# Patient Record
Sex: Male | Born: 1955 | Race: White | Hispanic: No | Marital: Married | Smoking: Former smoker
Health system: Southern US, Community
[De-identification: ages and names within clinical notes are randomized; demographics above are authoritative.]

## PROBLEM LIST (undated history)

## (undated) DIAGNOSIS — I10 Essential (primary) hypertension: Secondary | ICD-10-CM

## (undated) DIAGNOSIS — K219 Gastro-esophageal reflux disease without esophagitis: Secondary | ICD-10-CM

## (undated) DIAGNOSIS — Z5189 Encounter for other specified aftercare: Secondary | ICD-10-CM

## (undated) DIAGNOSIS — IMO0001 Reserved for inherently not codable concepts without codable children: Secondary | ICD-10-CM

## (undated) DIAGNOSIS — C801 Malignant (primary) neoplasm, unspecified: Secondary | ICD-10-CM

## (undated) HISTORY — PX: NECK SURGERY: SHX720

## (undated) HISTORY — PX: PROSTATE SURGERY: SHX751

---

## 1998-01-15 ENCOUNTER — Ambulatory Visit (HOSPITAL_COMMUNITY): Admission: RE | Admit: 1998-01-15 | Discharge: 1998-01-15 | Payer: Self-pay | Admitting: Internal Medicine

## 1998-11-23 ENCOUNTER — Ambulatory Visit (HOSPITAL_COMMUNITY): Admission: RE | Admit: 1998-11-23 | Discharge: 1998-11-23 | Payer: Self-pay | Admitting: Internal Medicine

## 1998-11-23 ENCOUNTER — Encounter: Payer: Self-pay | Admitting: Internal Medicine

## 2003-01-26 ENCOUNTER — Emergency Department (HOSPITAL_COMMUNITY): Admission: EM | Admit: 2003-01-26 | Discharge: 2003-01-26 | Payer: Self-pay | Admitting: Emergency Medicine

## 2003-01-26 ENCOUNTER — Encounter: Payer: Self-pay | Admitting: Emergency Medicine

## 2003-03-10 ENCOUNTER — Encounter: Payer: Self-pay | Admitting: Internal Medicine

## 2003-03-10 ENCOUNTER — Ambulatory Visit (HOSPITAL_COMMUNITY): Admission: RE | Admit: 2003-03-10 | Discharge: 2003-03-10 | Payer: Self-pay | Admitting: *Deleted

## 2003-03-10 ENCOUNTER — Encounter (INDEPENDENT_AMBULATORY_CARE_PROVIDER_SITE_OTHER): Payer: Self-pay | Admitting: Specialist

## 2008-05-02 DIAGNOSIS — I10 Essential (primary) hypertension: Secondary | ICD-10-CM | POA: Insufficient documentation

## 2008-05-02 DIAGNOSIS — J45909 Unspecified asthma, uncomplicated: Secondary | ICD-10-CM | POA: Insufficient documentation

## 2008-05-05 ENCOUNTER — Ambulatory Visit: Payer: Self-pay | Admitting: Internal Medicine

## 2008-05-05 DIAGNOSIS — R1013 Epigastric pain: Secondary | ICD-10-CM

## 2008-05-05 DIAGNOSIS — Z860100 Personal history of colon polyps, unspecified: Secondary | ICD-10-CM | POA: Insufficient documentation

## 2008-05-30 ENCOUNTER — Ambulatory Visit: Payer: Self-pay | Admitting: Internal Medicine

## 2008-05-30 ENCOUNTER — Encounter: Payer: Self-pay | Admitting: Internal Medicine

## 2008-06-03 ENCOUNTER — Encounter: Payer: Self-pay | Admitting: Internal Medicine

## 2008-08-14 ENCOUNTER — Encounter: Payer: Self-pay | Admitting: Internal Medicine

## 2009-03-31 ENCOUNTER — Encounter: Payer: Self-pay | Admitting: Emergency Medicine

## 2009-04-02 ENCOUNTER — Encounter: Admission: RE | Admit: 2009-04-02 | Discharge: 2009-04-02 | Payer: Self-pay | Admitting: Internal Medicine

## 2009-04-09 ENCOUNTER — Encounter: Payer: Self-pay | Admitting: Emergency Medicine

## 2009-04-09 ENCOUNTER — Ambulatory Visit (HOSPITAL_COMMUNITY): Admission: RE | Admit: 2009-04-09 | Discharge: 2009-04-09 | Payer: Self-pay | Admitting: Internal Medicine

## 2009-05-11 ENCOUNTER — Ambulatory Visit: Payer: Self-pay | Admitting: Emergency Medicine

## 2009-05-11 DIAGNOSIS — R0602 Shortness of breath: Secondary | ICD-10-CM | POA: Insufficient documentation

## 2009-05-13 ENCOUNTER — Ambulatory Visit (HOSPITAL_COMMUNITY): Admission: RE | Admit: 2009-05-13 | Discharge: 2009-05-13 | Payer: Self-pay | Admitting: Emergency Medicine

## 2009-05-15 ENCOUNTER — Ambulatory Visit: Payer: Self-pay | Admitting: Emergency Medicine

## 2009-06-19 ENCOUNTER — Ambulatory Visit: Payer: Self-pay | Admitting: Emergency Medicine

## 2009-06-19 DIAGNOSIS — J309 Allergic rhinitis, unspecified: Secondary | ICD-10-CM | POA: Insufficient documentation

## 2009-07-27 ENCOUNTER — Encounter: Payer: Self-pay | Admitting: Emergency Medicine

## 2010-07-12 ENCOUNTER — Encounter: Admission: RE | Admit: 2010-07-12 | Discharge: 2010-07-12 | Payer: Self-pay | Admitting: Sports Medicine

## 2010-07-27 ENCOUNTER — Encounter
Admission: RE | Admit: 2010-07-27 | Discharge: 2010-07-27 | Payer: Self-pay | Source: Home / Self Care | Attending: Sports Medicine | Admitting: Sports Medicine

## 2010-08-06 ENCOUNTER — Ambulatory Visit (HOSPITAL_COMMUNITY)
Admission: RE | Admit: 2010-08-06 | Discharge: 2010-08-06 | Payer: Self-pay | Source: Home / Self Care | Attending: Neurosurgery | Admitting: Neurosurgery

## 2010-08-30 ENCOUNTER — Ambulatory Visit (HOSPITAL_COMMUNITY)
Admission: RE | Admit: 2010-08-30 | Discharge: 2010-08-31 | Payer: Self-pay | Source: Home / Self Care | Attending: Neurosurgery | Admitting: Neurosurgery

## 2010-08-30 LAB — CBC
HCT: 41 % (ref 39.0–52.0)
Hemoglobin: 14.3 g/dL (ref 13.0–17.0)
MCH: 32.5 pg (ref 26.0–34.0)
MCHC: 34.9 g/dL (ref 30.0–36.0)
MCV: 93.2 fL (ref 78.0–100.0)
Platelets: 191 10*3/uL (ref 150–400)
RBC: 4.4 MIL/uL (ref 4.22–5.81)
RDW: 13.2 % (ref 11.5–15.5)
WBC: 8.9 10*3/uL (ref 4.0–10.5)

## 2010-08-30 LAB — SURGICAL PCR SCREEN
MRSA, PCR: NEGATIVE
Staphylococcus aureus: NEGATIVE

## 2010-08-30 LAB — BASIC METABOLIC PANEL
BUN: 6 mg/dL (ref 6–23)
CO2: 28 mEq/L (ref 19–32)
Calcium: 10.1 mg/dL (ref 8.4–10.5)
Chloride: 95 mEq/L — ABNORMAL LOW (ref 96–112)
Creatinine, Ser: 0.82 mg/dL (ref 0.4–1.5)
GFR calc Af Amer: >60 mL/min (ref >60)
GFR calc non Af Amer: >60 mL/min (ref >60)
Glucose, Bld: 98 mg/dL (ref 70–99)
Potassium: 3.6 mEq/L (ref 3.5–5.1)
Sodium: 135 mEq/L (ref 135–145)

## 2010-09-07 NOTE — Letter (Signed)
Summary: [facility]  [facility]   Imported By: Lester Arcade 10/01/2009 08:56:11  _____________________________________________________________________  External Attachment:    Type:   Image     Comment:   External Document

## 2010-09-09 NOTE — Op Note (Signed)
NAME:  KAIL, FRALEY NO.:  [id number]  MEDICAL RECORD NO.:  [id number]          PATIENT TYPE:  OIB  LOCATION:  3533                         FACILITY:  [facility]  PHYSICIAN:  Hewitt Shorts, M.D.DATE OF BIRTH:  1956/04/19  DATE OF PROCEDURE:  08/30/2010 DATE OF DISCHARGE:                              OPERATIVE REPORT   PREOPERATIVE DIAGNOSES:  Cervical spondylosis, cervical degenerative disk disease, cervical stenosis, and cervical radiculopathy.  POSTOPERATIVE DIAGNOSES:  Cervical spondylosis, cervical degenerative disk disease, cervical stenosis, and cervical radiculopathy.  PROCEDURE:  C5-6, C6-7, and C7-T1 anterior cervical decompression, arthrodesis with allograft and Tether cervical plating.  SURGEON:  Hewitt Shorts, MD  ASSISTANT:  Clydene Fake, MD  ANESTHESIA:  General endotracheal.  INDICATIONS:  The patient is a 55 year old man who presented with right cervical radiculopathy with weakness of the intrinsics and grip of the right hand.  He was found with x-rays, MRI scan, myelogram, and postmyelogram CT scan which showed multilevel degenerative disk disease, spondylosis with significant neural foraminal stenosis particularly on the right at C6-7 and C7-T1.  Decision was made to proceed with three- level anterior decompression and arthrodesis.  PROCEDURE:  The patient was brought to the operating room and placed on general anesthesia.  The patient was placed in 10 pounds of Holter traction.  Neck was prepped with Betadine soap and solution and draped in a sterile fashion.  An oblique incision was made on the left side of neck paralleling the anterior border of the left sternocleidomastoid. Dissection was carried down through the subcutaneous tissue and platysma.  Bipolar electrocautery was used to maintain hemostasis. Dissection was then carried out through the avascular plane leaving the sternocleidomastoid, carotid, and jugular vein  laterally and trachea and esophagus medially.  The ventral aspect of vertebral column was identified and localized x-ray was taken.  The C5-6, C6-7, C7-T1 intervertebral disk space was identified.  Next, the operating microscope was draped and brought onto the field to provide additional navigation, illumination, and visualization.  The remainder of decompression was performed using microdissection and microsurgical technique.  Diskectomy was begun at the each level with incision of the annulus and continued with microcurettes and pituitary rongeurs. Anterior osteophytic overgrowth was removed using the high-speed drill and 2-mm Kerrison punch.  Each level showed significant spondylotic degeneration.  The cartilaginous endplates were carefully removed using microcurettes and the high-speed drill and then posterior osteophytic overgrowth was removed at each level using the high-speed drill along with a 2-mm Kerrison punch with a thin footplate.  Posterior longitudinal ligament was opened and then we carefully removed the posterior longitudinal ligament using a 2-mm Kerrison punch with a thin footplate.  The dissection was carried laterally and the decompression was carried out laterally decompressing the neural foramen with particular attention paid to the right-sided neural foramen.  We were able to identify the nerve root and decompressed the spondylitic impingement and encroachment at each level.  Once decompression was completed, hemostasis was established with the use of Gelfoam soaked in thrombin.  We selected three 7-mm allograft implants and positioned in the intervertebral disk space and countersunk.  We placed Gelfoam  lateral to the graft and then selected a 52-mm three-level Tether cervical plate.  It was secured to the vertebra with 4 x 14 mm screws using variable screws at C5, single fixed screw at C6, single variable screws at C7, and the PerFix screws at T1.  Each screw hole  was drilled and tapped and the screws were placed in alternating fashion.  Once all 6 screws were in place, final tightening was performed.  The wound was irrigated with bacitracin solution.  An x-ray was taken which showed only the upper portion of the construct, which showed the plate, screws, and grafts in good position.  Once hemostasis was established, we proceeded with closure.  Platysma was approximated with interrupted 2-0 undyed Vicryl sutures.  Subcutaneous and subcuticular were closed with inverted 3-0 undyed Vicryl sutures.  Skin was approximated with Dermabond.  Procedure was tolerated well.  Estimated blood loss was 100 mL.  Sponge count correct.  Following surgery, the patient was taken out of traction to be reversed from the anesthetic, extubated, and transferred to the recovery room for further care.     Hewitt Shorts, M.D.     RWN/MEDQ  D:  08/30/2010  T:  08/31/2010  Job:  [id number]  Electronically Signed by Shirlean Kelly M.D. on 09/09/2010 10:28:17 AM

## 2010-12-24 NOTE — Consult Note (Signed)
NAME:  Blackburn, Kevin.:  [id number]   MEDICAL RECORD NO.:  [id number]                   PATIENT TYPE:  EMS   LOCATION:  MAJO                                 FACILITY:  [facility]   PHYSICIAN:  Candyce Churn, M.D.          DATE OF BIRTH:  [birth year]   DATE OF CONSULTATION:  DATE OF DISCHARGE:                                   CONSULTATION   CHIEF COMPLAINT:  Weakness and anxiousness.   HISTORY OF PRESENT ILLNESS:  [name] is a pleasant 55 year old male with  a history of  well controlled  hypertension and mild allergic rhinitis. Mr.  Kevin Blackburn was feeling in his usual state of health until a couple of days ago  he felt slightly weak. He actually felt OK yesterday, but then this morning  after going to the hospital  and having lunch where his father is  hospitalized for recurrent surgery for colon cancer, he felt weak and  somewhat shaky at the hospital  and his wife drove him home. On returning  home he felt weak and shaky and jittery and his hands became very tingly.  His wife noticed that his hands seemed to be both cyanotic and then turned  colors. He felt more anxious at that point  and was thinking that he might  be hyperventilating. He was brought to the [facility] emergency  room and an EKG revealed premature ventricular contractions.   He has never had a history of coronary artery disease. He exerts himself  physically quite rigorously on a daily basis at his work and has never had  exertional chest, throat or arm pain. It has just been in the last 24 to 48  hours when he has felt somewhat weak.   MEDICATIONS:  1. Lisinopril 10 mg daily.  2. Hydrochlorothiazide 12.5 mg daily.  3. Potassium 20 mEq daily.   PHYSICAL EXAMINATION:  VITAL SIGNS:  Blood pressure 175/96, temperature  98,  pulse 121, respiratory rate was 20 and easy. This was on admission; after  resting quietly his pulse is 97, respiratory rate is 16 and easy  and he  feels quite well.  GENERAL:  He no longer had the tingling in his hands.  HEENT:  Normal cranial nerves.  NECK:  No JVD, no thyromegaly, no thyroid tenderness. Neck is supple.  CHEST:  Clear to auscultation.  CARDIAC:  Regular rate and rhythm. Monitor  reveals occasional PVC.   LABORATORY DATA:  An EKG on admission revealed sinus rhythm with premature  ventricular contractions every 4th or 5th beat. There is no ST segment  elevation or depression. No Q-waves present. Otherwise normal except for the  premature ventricular contractions.   Creatinine 1.0, glucose 97, BUN 8,  Sodium 131, potassium 3.6, chloride  97,  bicarbonate 22. pH 7.447, pCO2 is low at 30. Hemoglobin 16, hematocrit 48.   ASSESSMENT:  A 55 year old male with a history of  well controlled  hypertension, but on premature history of coronary artery disease or smoking  history or high cholesterol. I suspect that he may have been hyperadrenergic  from anxiety. I think he has been under a lot of stress secondary to his  father being sick and what that means in terms of his care for  his mother.   He did have a chest CT scan today which revealed no evidence of pulmonary  embolism and otherwise  appeared  normal. There is no deep venous thrombosis  in his lower extremities.   I suspect that he may have actually had a Raynaud's like phenomenon occur. I  do think that he almost certainly was hyperventilating. He has tended to  have some anxiety in the past as well.   PLAN:  1. Will treat with Lopressor 25 mg b.i.d. with first dose in the morning.  2. Xanax 0.25 mg q.6-8h. p.r.n.  3. Plan to see back in the office in 24 to 48 hours and will likely discuss     having him perform a stress test later this week, although my suspicion     for coronary artery disease is extremely low. His hand tingling has     resolved, and he does state that he felt that he was probably was     hyperventilating.   He will call me if  any of these symptoms reoccur over the next 24 hours. I  will be giving him my beeper number as well.                                               Candyce Churn, M.D.    RNG/MEDQ  D:  01/26/2003  T:  01/27/2003  Job:  [id number]

## 2011-01-07 ENCOUNTER — Other Ambulatory Visit: Payer: Self-pay | Admitting: Urology

## 2011-01-07 ENCOUNTER — Encounter (HOSPITAL_COMMUNITY): Payer: 59

## 2011-01-07 LAB — CBC
HCT: 45 % (ref 39.0–52.0)
Hemoglobin: 16.6 g/dL (ref 13.0–17.0)
MCH: 33.9 pg (ref 26.0–34.0)
MCHC: 36.9 g/dL — ABNORMAL HIGH (ref 30.0–36.0)
MCV: 92 fL (ref 78.0–100.0)
RBC: 4.89 MIL/uL (ref 4.22–5.81)

## 2011-01-07 LAB — BASIC METABOLIC PANEL
BUN: 6 mg/dL (ref 6–23)
CO2: 32 mEq/L (ref 19–32)
Calcium: 10 mg/dL (ref 8.4–10.5)
Chloride: 87 mEq/L — ABNORMAL LOW (ref 96–112)
Creatinine, Ser: 0.69 mg/dL (ref 0.4–1.5)
GFR calc Af Amer: >60 mL/min (ref >60)
Glucose, Bld: 97 mg/dL (ref 70–99)

## 2011-01-07 LAB — SURGICAL PCR SCREEN: MRSA, PCR: NEGATIVE

## 2011-01-12 ENCOUNTER — Inpatient Hospital Stay (HOSPITAL_COMMUNITY)
Admission: RE | Admit: 2011-01-12 | Discharge: 2011-01-12 | DRG: 641 | Disposition: A | Discharge status: Home / Self Care | Payer: 59 | Source: Ambulatory Visit | Attending: Urology | Admitting: Urology

## 2011-01-12 DIAGNOSIS — E876 Hypokalemia: Principal | ICD-10-CM | POA: Diagnosis present

## 2011-01-12 DIAGNOSIS — Z5309 Procedure and treatment not carried out because of other contraindication: Secondary | ICD-10-CM

## 2011-01-12 DIAGNOSIS — C61 Malignant neoplasm of prostate: Secondary | ICD-10-CM | POA: Diagnosis present

## 2011-01-12 DIAGNOSIS — Z01812 Encounter for preprocedural laboratory examination: Secondary | ICD-10-CM

## 2011-01-12 LAB — POTASSIUM: Potassium: 2.7 mEq/L — CL (ref 3.5–5.1)

## 2011-01-12 LAB — TYPE AND SCREEN: Antibody Screen: NEGATIVE

## 2011-01-19 ENCOUNTER — Encounter (HOSPITAL_COMMUNITY): Payer: 59

## 2011-01-19 ENCOUNTER — Other Ambulatory Visit: Payer: Self-pay | Admitting: Urology

## 2011-01-21 ENCOUNTER — Other Ambulatory Visit: Payer: Self-pay | Admitting: Urology

## 2011-01-21 ENCOUNTER — Inpatient Hospital Stay (HOSPITAL_COMMUNITY)
Admission: RE | Admit: 2011-01-21 | Discharge: 2011-01-22 | DRG: 708 | Disposition: A | Discharge status: Home / Self Care | Payer: 59 | Source: Ambulatory Visit | Attending: Urology | Admitting: Urology

## 2011-01-21 DIAGNOSIS — I10 Essential (primary) hypertension: Secondary | ICD-10-CM | POA: Diagnosis present

## 2011-01-21 DIAGNOSIS — Z01812 Encounter for preprocedural laboratory examination: Secondary | ICD-10-CM

## 2011-01-21 DIAGNOSIS — K219 Gastro-esophageal reflux disease without esophagitis: Secondary | ICD-10-CM | POA: Diagnosis present

## 2011-01-21 DIAGNOSIS — Z8042 Family history of malignant neoplasm of prostate: Secondary | ICD-10-CM

## 2011-01-21 DIAGNOSIS — C61 Malignant neoplasm of prostate: Principal | ICD-10-CM | POA: Diagnosis present

## 2011-01-21 LAB — CBC
MCH: 32.5 pg (ref 26.0–34.0)
MCHC: 34.7 g/dL (ref 30.0–36.0)
MCV: 93.6 fL (ref 78.0–100.0)
Platelets: 254 10*3/uL (ref 150–400)
RBC: 4.4 MIL/uL (ref 4.22–5.81)

## 2011-01-21 LAB — BASIC METABOLIC PANEL
CO2: 27 mEq/L (ref 19–32)
Calcium: 8.7 mg/dL (ref 8.4–10.5)
Creatinine, Ser: 0.78 mg/dL (ref 0.50–1.35)
GFR calc non Af Amer: >60 mL/min (ref >60)
Glucose, Bld: 111 mg/dL — ABNORMAL HIGH (ref 70–99)

## 2011-01-21 LAB — TYPE AND SCREEN: ABO/RH(D): O POS

## 2011-01-22 LAB — DIFFERENTIAL
Basophils Absolute: 0 10*3/uL (ref 0.0–0.1)
Basophils Relative: 0 % (ref 0–1)
Eosinophils Absolute: 0.1 10*3/uL (ref 0.0–0.7)
Eosinophils Relative: 1 % (ref 0–5)
Monocytes Absolute: 1.2 10*3/uL — ABNORMAL HIGH (ref 0.1–1.0)

## 2011-01-22 LAB — BASIC METABOLIC PANEL
BUN: 7 mg/dL (ref 6–23)
Calcium: 8.4 mg/dL (ref 8.4–10.5)
GFR calc non Af Amer: >60 mL/min (ref >60)
Glucose, Bld: 121 mg/dL — ABNORMAL HIGH (ref 70–99)
Sodium: 128 mEq/L — ABNORMAL LOW (ref 135–145)

## 2011-01-22 LAB — CBC: MCH: 32.2 pg (ref 26.0–34.0)

## 2011-02-17 NOTE — Op Note (Signed)
NAME:  Kevin Blackburn, Kevin Blackburn NO.:  [id number]  MEDICAL RECORD NO.:  [id number]  LOCATION:  1412                         FACILITY:  [facility]  PHYSICIAN:  Valetta Fuller, M.D.  DATE OF BIRTH:  12/29/1955  DATE OF PROCEDURE:  01/21/2011 DATE OF DISCHARGE:  01/22/2011                              OPERATIVE REPORT   PREOPERATIVE DIAGNOSIS:  Adenocarcinoma of the prostate.  POSTOPERATIVE DIAGNOSIS:  Adenocarcinoma of the prostate.  PROCEDURE PERFORMED:  Robotic-assisted laparoscopic radical retropubic prostatectomy with bilateral pelvic lymph node dissection.  SURGEON:  Valetta Fuller, M.D.  ASSISTANT:  Delia Chimes, NP  ANESTHESIA:  General endotracheal  INDICATIONS:  Mr. Burdell is 55 years of age.  The patient was sent to see me with an elevated PSA of just over 7 and a positive family history of prostate cancer.  The patient's ultrasound revealed a small 20-g prostate and digital rectal exam was unremarkable.  On the right side, the patient had Gleason 3+4= 7 involving 3 out of 6 cores.  Left-sided biopsies were negative for cancer, although 4 out of 6 cores did show either focal high-grade PIN or some atypia.  The patient underwent extensive counseling with regard to treatment options.  He was felt to have intermediate risk clinical stage T1c adenocarcinoma of the prostate.  After lengthy discussions, he elected to proceed with a surgical approach.  He understands the advantages and disadvantages and potential complications of such surgery.  The patient's procedure was actually scheduled for last week but he was noted to have hypokalemia. His surgery was temporarily postponed while he received potassium supplementation and repeat potassium was 4.2.  The patient has done a mechanical bowel prep.  He has had placement of PAS compression boots and received perioperative Unasyn.  TECHNIQUE AND FINDINGS:  The patient was brought to the operating room where he had  successful induction of general endotracheal anesthesia. He was placed in lithotomy position.  All extremities were carefully padded and he was secured to the operative table and then placed in steep Trendelenburg position.  He was prepped and draped in usual manner.  Appropriate surgical time-out was performed.  Foley catheter was inserted sterilely on the field.  Camera port incision was chosen 18 cm above the pubic symphysis just left to the umbilicus.  Standard open Roseanne Reno technique was utilized and placement of 12 mm trocar and initial insufflation of the abdomen occurred without incident.  Evaluation of the pelvis revealed no obvious abnormalities and all other trocars were placed with direct visual guidance including three 8-mm robotic trocars and additional 12-mm and 5-mm assist trocars.  Once trocars were placed, the surgical cart was docked.  The bladder was filled and space of Retzius developed with electrocautery scissors and blunt dissecting technique.  Fatty tissue over the endopelvic fascia and bladder neck was removed with electrocautery scissors.  Endopelvic fascia was then incised from base to apex.  There was quite a bit of thickened levator musculature adherent to the apex of the prostate bilaterally.  This was swept away isolating the dorsal venous complex which was then stapled with an ETS stapling device with good hemostasis.  The Foley balloon was utilized to identify the  anterior bladder neck which was then transected.  Indigo carmine was given and we were well away from the ureteral orifices.  There was no evidence of middle lobe of the prostate.  The posterior bladder neck was transected and the dissection carried down to the vas deferens which were individually dissected free and then retracted anteriorly.  Seminal vesicles were also dissected in a similar manner with clips placed on the tips of the seminal vesicles. With these structures held anteriorly, the  posterior plane between the prostate and rectum was established with blunt dissection.  We felt that the patient was a candidate primarily for unilateral nerve spare.  On the side with the cancer, the right side, a wide excision of the neurovascular bundle was undertaken utilizing an Enseal device.  On the left side, superficial fascia anteriorly on the prostate was incised and then swept posterolaterally until we were able to establish a groove between the lateral aspect of the prostate and the neurovascular bundle. With the prostate retracted anteriorly, the pedicle of the prostate was taken down with Enseal device as well as some clips.  With the Foley in position, the anterior urethra was transected.  The posterior urethra was then transected and rectourethralis muscle sharply incised allowing for removal of the prostate specimen.  Rectal insufflation revealed no evidence of any rectal injury.  Hemostasis remained quite good.  Bilateral pelvic lymph node dissections were performed removing the obturator packets and extending upward to the bifurcation of the iliac artery.  No grossly enlarged nodes were encountered.  Obturator nerves were identified bilaterally and preserved and small clips were used for venous and lymphatic channels.  Both packets were sent separately right and left for permanent analysis.  Attention was then turned to reconstruction.  The bladder neck was small and required no additional reconstruction.  Indigo carmine was again given and we were well away from the ureteral orifices.  The posterior bladder neck and posterior urethra were reapproximated with a 2-0 Vicryl suture.  The rest of the anastomosis was done with a double-armed 3-0 Monocryl suture in a running manner.  A new catheter was placed and irrigation revealed no evidence of leakage.  Irrigation of the bladder revealed blue dye tinged irrigant.  A pelvic drain was placed through one of the robotic  trocars and secured to the skin.  The prostate was placed in an Endopouch retrieval system.  The 12-mm trocar site was closed with Vicryl suture with aid of a suture passer under direct visual guidance.  Other trocars were taken out with visual guidance. The camera port incision was extended slightly to allow for removal of specimen and that fascia was closed with a running Vicryl.  All port and incision sites were infiltrated with Marcaine and closed with clips. The patient did not appear to have any obvious complications and was brought to recovery room in stable condition.     Valetta Fuller, M.D.     DSG/MEDQ  D:  01/23/2011  T:  01/24/2011  Job:  [id number]  cc:   Pearla Dubonnet, M.D. Fax: [phone]  Electronically Signed by Barron Alvine M.D. on 02/17/2011 05:43:54 PM

## 2011-03-02 NOTE — Discharge Summary (Signed)
NAME:  Kevin Blackburn, LATA NO.:  [id number]  MEDICAL RECORD NO.:  [id number]  LOCATION:  1412                         FACILITY:  [facility]  PHYSICIAN:  Valetta Fuller, MD    DATE OF BIRTH:  12-12-1955  DATE OF ADMISSION:  01/21/2011 DATE OF DISCHARGE:  01/22/2011                              DISCHARGE SUMMARY   ADMISSION DIAGNOSIS:  Adenocarcinoma of the prostate.  DISCHARGE DIAGNOSIS:  Adenocarcinoma of the prostate.  PROCEDURE: 1. Robotic-assisted laparoscopic radical retropubic prostatectomy. 2. Bilateral pelvic lymph node dissection.  HISTORY AND PHYSICAL:  For full details, please see admission history and physical.  Briefly, [name] is a 55 year old gentleman who was found to have clinically-localized adenocarcinoma of the prostate. After careful consideration regarding management options for treatment, he elected to proceed with surgical therapy and a robotic-assisted laparoscopic radical retropubic prostatectomy.  HOSPITAL COURSE:  On January 21, 2011, he was taken to the operating room where he underwent the above-named procedures which he tolerated well without complications.  Postoperatively, he was able to be transferred to a regular hospital room following recovery from the anesthesia.  He was found to be hemodynamically stable postoperatively as noted by hemoglobin of 14.3.  He was also found to be stable on postop day #1 as noted by hemoglobin of 11.9.  He was able to begin a clear liquids diet and ambulation after surgery.  On the morning of postoperative day #1, he was able to be transitioned to oral pain medications.  He was found to have excellent urine output within the first 24 hours with minimal output from his pelvic drain; therefore, his pelvic drain was removed on postoperative day #1.  On the mid morning of postoperative day #1, he was ambulating without difficulty, his pain was well managed, and he was found to be tolerating a clear  liquid diet without complications. Therefore, he was felt stable for discharge home as he had met all discharge criteria.  DISPOSITION:  Home.  DISCHARGE MEDICATIONS:  He was instructed to resume all home medications.  In addition, he was instructed to hold his naproxen, aspirin, Senna, and calcium with vitamin D for 7 to 10 days.  He was given a prescription for Vicodin to take as needed for pain and told to use Colace as stool softener.  He was also given a prescription for Cipro to begin 2 days prior to return visit for removal of Foley catheter.  DISCHARGE INSTRUCTIONS:  He was instructed to be ambulatory but specifically told to refrain from any heavy lifting, strenuous activity, or driving.  He was instructed on routine Foley catheter care and told to gradually advance his diet over the course of the next few days.  PATHOLOGY:  His pathology returned as prostatic adenocarcinoma, Gleason score 3 + 4, 8 + 7 involving both lobes.  Clinical stage, pT2c, pN0.  FOLLOWUP:  He will follow up in 7 to 10 days for further postoperative evaluation including removal of skin staples and Foley catheter.     Delia Chimes, NP   ______________________________ Valetta Fuller, MD    MA/MEDQ  D:  02/23/2011  T:  02/23/2011  Job:  [id number]  Electronically Signed by  MELISSA ADAMS NP on 02/24/2011 10:38:11 AM Electronically Signed by Barron Alvine M.D. on 03/02/2011 08:44:34 AM

## 2011-03-02 NOTE — H&P (Signed)
  NAME:  Kevin Blackburn, KORAL NO.:  [id number]  MEDICAL RECORD NO.:  [id number]  LOCATION:  1412                         FACILITY:  [facility]  PHYSICIAN:  Valetta Fuller, MD    DATE OF BIRTH:  1956/06/19  DATE OF ADMISSION:  01/21/2011 DATE OF DISCHARGE:  01/22/2011                             HISTORY & PHYSICAL   ADMITTING DIAGNOSIS:  Adenocarcinoma of the prostate.  The patient to be admitted status post robotic-assisted laparoscopic radical retropubic prostatectomy and bilateral pelvic lymph node dissection.  HISTORY OF PRESENT ILLNESS:  [name] is 55 years of age.  The patient was diagnosed with prostate cancer after PSA elevation was noted to be just over 7.  Rectal exam was unremarkable.  Ultrasound revealed a small 20-g prostate.  The patient had 3 out of 6 cores positive on the right side for Gleason 3+4=7 cancer.  Left-sided biopsies were negative. The patient underwent extensive counseling with regard to treatment options and elected to proceed with robotic prostatectomy along with pelvic lymph node dissection.  The patient was originally scheduled for surgery approximately 10 days ago but was noted to be profoundly hyperkalemic and the surgery was cancelled by anesthesia services.  He has had potassium replacement and repeat potassium recently was 4.2. The patient now presents for the surgery with admission status post the surgery.  PAST MEDICAL HISTORY:  Notable for hypertension, asthma, and gastroesophageal reflux.  CURRENT MEDICATIONS:  Include potassium chloride, albuterol inhaler, Symbicort inhaler, amlodipine/benazepril, atenolol, omeprazole, calcium/vitamin D.  ALLERGIES:  The patient has an allergy to SULFA.  PHYSICAL EXAMINATION:  GENERAL:  On exam, the patient is a well- developed, well-nourished male, in no acute distress. NECK:  Showed no obvious JVD or masses. LUNGS:  Respiratory effort normal. HEART:  Regular rate and  rhythm. ABDOMEN:  Soft, nontender without hepatosplenomegaly or gross masses. GU:  External genitalia within normal limits.  Rectal exam revealed a 1+ prostate without nodules or induration. EXTREMITIES:  Without edema.  ASSESSMENT:  Intermediate risk clinical stage T1c adenocarcinoma of the prostate.  The patient will be hopefully admitted for routine postoperative care status post robotic prostatectomy with bilateral pelvic lymph node dissection.     Valetta Fuller, MD     DSG/MEDQ  D:  02/23/2011  T:  02/23/2011  Job:  [id number]  Electronically Signed by Barron Alvine M.D. on 03/02/2011 08:44:25 AM

## 2011-11-27 ENCOUNTER — Inpatient Hospital Stay (HOSPITAL_COMMUNITY)
Admission: EM | Admit: 2011-11-27 | Discharge: 2011-11-29 | DRG: 683 | Disposition: A | Discharge status: Other Acute Inpatient Hospital | Payer: 59 | Attending: Internal Medicine | Admitting: Internal Medicine

## 2011-11-27 ENCOUNTER — Encounter (HOSPITAL_COMMUNITY): Payer: Self-pay

## 2011-11-27 DIAGNOSIS — K219 Gastro-esophageal reflux disease without esophagitis: Secondary | ICD-10-CM | POA: Diagnosis present

## 2011-11-27 DIAGNOSIS — D6959 Other secondary thrombocytopenia: Secondary | ICD-10-CM | POA: Diagnosis present

## 2011-11-27 DIAGNOSIS — N289 Disorder of kidney and ureter, unspecified: Secondary | ICD-10-CM

## 2011-11-27 DIAGNOSIS — J309 Allergic rhinitis, unspecified: Secondary | ICD-10-CM | POA: Diagnosis present

## 2011-11-27 DIAGNOSIS — J45909 Unspecified asthma, uncomplicated: Secondary | ICD-10-CM | POA: Diagnosis present

## 2011-11-27 DIAGNOSIS — T465X5A Adverse effect of other antihypertensive drugs, initial encounter: Secondary | ICD-10-CM | POA: Diagnosis present

## 2011-11-27 DIAGNOSIS — I10 Essential (primary) hypertension: Secondary | ICD-10-CM | POA: Diagnosis present

## 2011-11-27 DIAGNOSIS — N179 Acute kidney failure, unspecified: Principal | ICD-10-CM | POA: Diagnosis present

## 2011-11-27 DIAGNOSIS — Z8546 Personal history of malignant neoplasm of prostate: Secondary | ICD-10-CM

## 2011-11-27 DIAGNOSIS — E871 Hypo-osmolality and hyponatremia: Secondary | ICD-10-CM | POA: Diagnosis present

## 2011-11-27 DIAGNOSIS — F10239 Alcohol dependence with withdrawal, unspecified: Secondary | ICD-10-CM | POA: Diagnosis present

## 2011-11-27 DIAGNOSIS — E669 Obesity, unspecified: Secondary | ICD-10-CM | POA: Diagnosis present

## 2011-11-27 DIAGNOSIS — D696 Thrombocytopenia, unspecified: Secondary | ICD-10-CM | POA: Diagnosis present

## 2011-11-27 DIAGNOSIS — Z7982 Long term (current) use of aspirin: Secondary | ICD-10-CM

## 2011-11-27 DIAGNOSIS — F10939 Alcohol use, unspecified with withdrawal, unspecified: Secondary | ICD-10-CM | POA: Diagnosis present

## 2011-11-27 DIAGNOSIS — K7030 Alcoholic cirrhosis of liver without ascites: Secondary | ICD-10-CM | POA: Diagnosis present

## 2011-11-27 DIAGNOSIS — I959 Hypotension, unspecified: Secondary | ICD-10-CM | POA: Diagnosis present

## 2011-11-27 DIAGNOSIS — T424X5A Adverse effect of benzodiazepines, initial encounter: Secondary | ICD-10-CM | POA: Diagnosis present

## 2011-11-27 DIAGNOSIS — E876 Hypokalemia: Secondary | ICD-10-CM | POA: Diagnosis not present

## 2011-11-27 DIAGNOSIS — F1020 Alcohol dependence, uncomplicated: Secondary | ICD-10-CM | POA: Diagnosis present

## 2011-11-27 DIAGNOSIS — F4321 Adjustment disorder with depressed mood: Secondary | ICD-10-CM | POA: Diagnosis present

## 2011-11-27 HISTORY — DX: Reserved for inherently not codable concepts without codable children: IMO0001

## 2011-11-27 HISTORY — DX: Essential (primary) hypertension: I10

## 2011-11-27 HISTORY — DX: Gastro-esophageal reflux disease without esophagitis: K21.9

## 2011-11-27 HISTORY — DX: Encounter for other specified aftercare: Z51.89

## 2011-11-27 HISTORY — DX: Malignant (primary) neoplasm, unspecified: C80.1

## 2011-11-27 LAB — DIFFERENTIAL
Basophils Absolute: 0 10*3/uL (ref 0.0–0.1)
Basophils Relative: 0 % (ref 0–1)
Eosinophils Absolute: 0.2 10*3/uL (ref 0.0–0.7)
Lymphocytes Relative: 28 % (ref 12–46)
Monocytes Absolute: 1.6 10*3/uL — ABNORMAL HIGH (ref 0.1–1.0)
Neutro Abs: 5.2 10*3/uL (ref 1.7–7.7)

## 2011-11-27 LAB — COMPREHENSIVE METABOLIC PANEL
ALT: 47 U/L (ref 0–53)
AST: 59 U/L — ABNORMAL HIGH (ref 0–37)
Alkaline Phosphatase: 75 U/L (ref 39–117)
CO2: 28 mEq/L (ref 19–32)
GFR calc Af Amer: 20 mL/min — ABNORMAL LOW (ref >90)
GFR calc non Af Amer: 17 mL/min — ABNORMAL LOW (ref >90)
Glucose, Bld: 103 mg/dL — ABNORMAL HIGH (ref 70–99)
Potassium: 2.8 mEq/L — ABNORMAL LOW (ref 3.5–5.1)
Sodium: 128 mEq/L — ABNORMAL LOW (ref 135–145)
Total Protein: 6.6 g/dL (ref 6.0–8.3)

## 2011-11-27 LAB — CBC
HCT: 35.9 % — ABNORMAL LOW (ref 39.0–52.0)
Hemoglobin: 12.4 g/dL — ABNORMAL LOW (ref 13.0–17.0)
MCH: 34.1 pg — ABNORMAL HIGH (ref 26.0–34.0)
MCHC: 34.5 g/dL (ref 30.0–36.0)
RDW: 14.2 % (ref 11.5–15.5)

## 2011-11-27 MED ORDER — POTASSIUM CHLORIDE 20 MEQ/15ML (10%) PO LIQD
40.0000 meq | Freq: Once | ORAL | Status: AC
Start: 2011-11-27 — End: 2011-11-27
  Administered 2011-11-27: 40 meq via ORAL

## 2011-11-27 MED ORDER — VITAMIN B-1 100 MG PO TABS
100.0000 mg | ORAL_TABLET | Freq: Every day | ORAL | Status: DC
  Administered 2011-11-28 – 2011-11-29 (×2): 100 mg via ORAL
  Filled 2011-11-27 (×2): qty 1

## 2011-11-27 MED ORDER — POTASSIUM CHLORIDE 20 MEQ/15ML (10%) PO LIQD
ORAL | Status: AC
  Administered 2011-11-27: 40 meq via ORAL
  Filled 2011-11-27: qty 30

## 2011-11-27 MED ORDER — POTASSIUM CHLORIDE 10 MEQ/100ML IV SOLN
10.0000 meq | INTRAVENOUS | Status: AC
  Administered 2011-11-28 (×3): 10 meq via INTRAVENOUS
  Filled 2011-11-27 (×3): qty 100

## 2011-11-27 MED ORDER — SODIUM CHLORIDE 0.9 % IV BOLUS (SEPSIS)
500.0000 mL | Freq: Once | INTRAVENOUS | Status: AC
  Administered 2011-11-27: 500 mL via INTRAVENOUS

## 2011-11-27 MED ORDER — THIAMINE HCL 100 MG/ML IJ SOLN
Freq: Once | INTRAVENOUS | Status: AC
  Administered 2011-11-28: 01:00:00 via INTRAVENOUS
  Filled 2011-11-27: qty 1000

## 2011-11-27 MED ORDER — LORAZEPAM 2 MG/ML IJ SOLN
1.0000 mg | Freq: Once | INTRAMUSCULAR | Status: AC
  Administered 2011-11-27: 1 mg via INTRAVENOUS

## 2011-11-27 MED ORDER — LORAZEPAM 2 MG/ML IJ SOLN
INTRAMUSCULAR | Status: AC
  Administered 2011-11-27: 1 mg via INTRAVENOUS
  Filled 2011-11-27: qty 1

## 2011-11-27 MED ORDER — ADULT MULTIVITAMIN W/MINERALS CH
1.0000 | ORAL_TABLET | Freq: Every day | ORAL | Status: DC
  Administered 2011-11-28 – 2011-11-29 (×3): 1 via ORAL
  Filled 2011-11-27 (×3): qty 1

## 2011-11-27 MED ORDER — ONDANSETRON HCL 4 MG PO TABS: 4.0000 mg | ORAL_TABLET | Freq: Four times a day (QID) | ORAL | Status: DC | PRN

## 2011-11-27 MED ORDER — SODIUM CHLORIDE 0.9 % IV SOLN
Freq: Once | INTRAVENOUS | Status: AC
  Administered 2011-11-27: 18:00:00 via INTRAVENOUS

## 2011-11-27 MED ORDER — SODIUM CHLORIDE 0.9 % IV SOLN
INTRAVENOUS | Status: AC
  Administered 2011-11-28: 01:00:00 via INTRAVENOUS

## 2011-11-27 MED ORDER — HYDROMORPHONE HCL PF 1 MG/ML IJ SOLN: 0.5000 mg | INTRAMUSCULAR | Status: DC | PRN

## 2011-11-27 MED ORDER — ONDANSETRON HCL 4 MG/2ML IJ SOLN: 4.0000 mg | Freq: Four times a day (QID) | INTRAMUSCULAR | Status: DC | PRN

## 2011-11-27 MED ORDER — FOLIC ACID 1 MG PO TABS
1.0000 mg | ORAL_TABLET | Freq: Every day | ORAL | Status: DC
  Administered 2011-11-28 – 2011-11-29 (×2): 1 mg via ORAL
  Filled 2011-11-27 (×2): qty 1

## 2011-11-27 MED ORDER — LORAZEPAM 1 MG PO TABS
1.0000 mg | ORAL_TABLET | Freq: Four times a day (QID) | ORAL | Status: DC | PRN
  Administered 2011-11-28: 1 mg via ORAL
  Filled 2011-11-27: qty 1

## 2011-11-27 MED ORDER — LORAZEPAM 2 MG/ML IJ SOLN: 1.0000 mg | Freq: Four times a day (QID) | INTRAMUSCULAR | Status: DC | PRN

## 2011-11-27 MED ORDER — ACETAMINOPHEN 650 MG RE SUPP: 650.0000 mg | Freq: Four times a day (QID) | RECTAL | Status: DC | PRN

## 2011-11-27 MED ORDER — ACETAMINOPHEN 325 MG PO TABS: 650.0000 mg | ORAL_TABLET | Freq: Four times a day (QID) | ORAL | Status: DC | PRN

## 2011-11-27 MED ORDER — OXYCODONE HCL 5 MG PO TABS
5.0000 mg | ORAL_TABLET | ORAL | Status: DC | PRN
  Administered 2011-11-29: 5 mg via ORAL
  Filled 2011-11-27: qty 1

## 2011-11-27 MED ORDER — THIAMINE HCL 100 MG/ML IJ SOLN
100.0000 mg | Freq: Every day | INTRAMUSCULAR | Status: DC
  Filled 2011-11-27 (×2): qty 1

## 2011-11-27 MED ORDER — SODIUM CHLORIDE 0.9 % IV BOLUS (SEPSIS)
1000.0000 mL | Freq: Once | INTRAVENOUS | Status: AC
  Administered 2011-11-27: 1000 mL via INTRAVENOUS

## 2011-11-27 NOTE — ED Notes (Signed)
Attempted to call report to nurse. Unavailable to take report. Will call back in 15 minutes.

## 2011-11-27 NOTE — ED Notes (Signed)
MD at bedside. 

## 2011-11-27 NOTE — ED Notes (Signed)
Pt states that he has had low blood pressure today and has taken his BP medications that he normally takes for HTN. He is positive for orthostatics. Pt denies chest pain. Pt has been at [facility] for alcohol detox and states they have given him medication for detox this morning but he's not sure what it is. He states that he has been more "wobbly" the last couple of days and the facility staff had requested he not get up without assistance.

## 2011-11-27 NOTE — ED Notes (Signed)
Wife at bs updated on pt current status. EDP Allen in to evaluate this pt- vitals given to EDP

## 2011-11-27 NOTE — ED Notes (Signed)
2nd attempt to call report- RN requesting more time.  Verbalized will give bedside report.  Charge Haematologist made aware

## 2011-11-27 NOTE — ED Notes (Signed)
Family at bedside. 

## 2011-11-27 NOTE — ED Notes (Signed)
[id number]<JY> Expected date:<BR> Expected time: 4:20 PM<BR> Means of arrival:<BR> Comments:<BR> M90 - 56yoM Low BP, transfer from [facility]

## 2011-11-27 NOTE — ED Notes (Signed)
Admitting doctor Lovell Sheehan in to evaluate this pt for admission- increased HR 133 and BP 116/60  Pt presents with tremors- apical HR 90- Charge Patty D RN made aware and in to see this pt- stat EKG performed medications given as ordered.  Pt alert and active with care- denies CP.  VSS- not real HR only tremors- post administration of ativan- tremors absent- wife present and updated on pt current status

## 2011-11-27 NOTE — ED Notes (Signed)
Patient denies pain and is resting comfortably.  

## 2011-11-27 NOTE — ED Notes (Signed)
Per [facility]_ Pt presents with no acute distress. Pt resides at [facility] for ETOH detox- on protocol for it- librium and valium protocol.  Reports from staff pt BP is labile. No other complaints

## 2011-11-27 NOTE — ED Provider Notes (Signed)
History     CSN: [id number]  Arrival date & time 11/27/11  1622   First MD Initiated Contact with Patient 11/27/11 1644      Chief Complaint  Patient presents with  . Hypotension    (Consider location/radiation/quality/duration/timing/severity/associated sxs/prior treatment) The history is provided by the patient.   Patient here from detox facility for alcohol complaining of hypotension. Patient's last use of alcohol was week ago. He does have a history of hypertension and is on multiple medications for this. His also has been receiving benzodiazepines for alcohol withdrawal. He denies vomiting or bloody stools. No fever. No shortness of breath or chest pain. Patient does get dizzy when he stands up. Denies any syncope. No tremors noted. EMS was called to 2 labile blood pressures at the facility.` Past Medical History  Diagnosis Date  . Hypertension   . GERD (gastroesophageal reflux disease)   . Blood transfusion   . Cancer     Past Surgical History  Procedure Date  . Neck surgery   . Prostate surgery     No family history on file.  History  Substance Use Topics  . Smoking status: Never Smoker   . Smokeless tobacco: Not on file  . Alcohol Use: Yes      Review of Systems  All other systems reviewed and are negative.    Allergies  Sulfonamide derivatives  Home Medications  No current outpatient prescriptions on file.  BP 90/51  Pulse 76  Resp 20  SpO2 93%  Physical Exam  Nursing note and vitals reviewed. Constitutional: He is oriented to person, place, and time. He appears well-developed and well-nourished.  Non-toxic appearance. No distress.  HENT:  Head: Normocephalic and atraumatic.  Eyes: Conjunctivae, EOM and lids are normal. Pupils are equal, round, and reactive to light.  Neck: Normal range of motion. Neck supple. No tracheal deviation present. No mass present.  Cardiovascular: Normal rate, regular rhythm and normal heart sounds.  Exam reveals no  gallop.   No murmur heard. Pulmonary/Chest: Effort normal and breath sounds normal. No stridor. No respiratory distress. He has no decreased breath sounds. He has no wheezes. He has no rhonchi. He has no rales.  Abdominal: Soft. Normal appearance and bowel sounds are normal. He exhibits no distension. There is no tenderness. There is no rebound and no CVA tenderness.  Musculoskeletal: Normal range of motion. He exhibits no edema and no tenderness.  Neurological: He is alert and oriented to person, place, and time. He has normal strength. No cranial nerve deficit or sensory deficit. GCS eye subscore is 4. GCS verbal subscore is 5. GCS motor subscore is 6.  Skin: Skin is warm and dry. No abrasion and no rash noted.  Psychiatric: His speech is normal. His affect is blunt. He is slowed.    ED Course  Procedures (including critical care time)   Labs Reviewed  COMPREHENSIVE METABOLIC PANEL  CBC  DIFFERENTIAL   No results found.   No diagnosis found.    MDM   Date: 11/27/2011  Rate: 75  Rhythm: normal sinus rhythm  QRS Axis: normal  Intervals: normal  ST/T Wave abnormalities: nonspecific T wave changes  Conduction Disutrbances:none  Narrative Interpretation:   Old EKG Reviewed: unchanged        6:35 PM Patient given IV fluids and has responded well. Suspect the cause of patient's renal insufficiency and is due to his medications. Patient is to be admitted by the hospitalist  Toy Baker, MD 11/27/11 1836

## 2011-11-27 NOTE — H&P (Addendum)
DATE OF ADMISSION:  11/27/2011  PCP:   Pearla Dubonnet, MD, MD   Chief Complaint: Dizzy, Low Blood Pressure   HPI: Kevin Blackburn is an 56 y.o. male who was admitted to [facility] 4 days ago for Alcohol Detox therapy and was sent for evaluation in the ED due to increased dizziness, and was found to have low blood pressure.   He denies any nausea or vomiting or headache. He was found to have hypotension and orthostasis in the ED, and multiple electrolyte abnormalities and an elevated BUN and Creatinine.  Before he was admitted to [facility] he did have severe vomiting the day before, per report of his wife who is at the bedside.    Past Medical History  Diagnosis Date  . Hypertension   . GERD (gastroesophageal reflux disease)   . Blood transfusion   . Cancer     Past Surgical History  Procedure Date  . Neck surgery   . Prostate surgery     Medications:  HOME MEDS: Prior to Admission medications   Medication Sig Start Date End Date Taking? Authorizing Provider  albuterol (PROVENTIL HFA;VENTOLIN HFA) 108 (90 BASE) MCG/ACT inhaler Inhale 2 puffs into the lungs every 6 (six) hours as needed. For shortness of breath and wheezing   Yes Historical Provider, MD  alum & mag hydroxide-simeth (MAALOX/MYLANTA) 200-200-20 MG/5ML suspension Take 10-20 mLs by mouth every 6 (six) hours as needed. For indigestion   Yes Historical Provider, MD  anti-nausea (EMETROL) solution Take 15-30 mLs by mouth every 15 (fifteen) minutes as needed. Until relief   Yes Historical Provider, MD  aspirin EC 81 MG tablet Take 81 mg by mouth daily.   Yes Historical Provider, MD  Benzocaine 10 MG LOZG Use as directed 1 lozenge in the mouth or throat every 3 (three) hours as needed. Sore throat   Yes Historical Provider, MD  benzonatate (TESSALON) 100 MG capsule Take 100-200 mg by mouth every 8 (eight) hours as needed. For cough   Yes Historical Provider, MD  budesonide-formoterol (SYMBICORT) 160-4.5  MCG/ACT inhaler Inhale 2 puffs into the lungs 2 (two) times daily.   Yes Historical Provider, MD  chlordiazePOXIDE (LIBRIUM) 10 MG capsule Take 35 mg by mouth once as needed.   Yes Historical Provider, MD  chlordiazePOXIDE (LIBRIUM) 10 MG capsule Take 10 mg by mouth See admin instructions. Four times daily for 4 doses   Yes Historical Provider, MD  diazepam (VALIUM) 5 MG/ML injection Inject 10 mg into the vein once.   Yes Historical Provider, MD  guaiFENesin (MUCINEX) 600 MG 12 hr tablet Take 1,200 mg by mouth 2 (two) times daily at 10 AM and 5 PM.   Yes Historical Provider, MD  loperamide (IMODIUM) 2 MG capsule Take 2-4 mg by mouth 4 (four) times daily as needed. Following each diarrhea stool   Yes Historical Provider, MD  MULTIPLE VITAMIN-FOLIC ACID PO Take 1 tablet by mouth daily.   Yes Historical Provider, MD  potassium chloride (KLOR-CON) 20 MEQ packet Take 20 mEq by mouth daily.   Yes Historical Provider, MD  sertraline (ZOLOFT) 50 MG tablet Take 50 mg by mouth daily.   Yes Historical Provider, MD  thiamine (VITAMIN B-1) 100 MG tablet Take 100 mg by mouth daily.   Yes Historical Provider, MD  atenolol-chlorthalidone (TENORETIC) 100-25 MG per tablet Take 1 tablet by mouth daily.    Historical Provider, MD  chlordiazePOXIDE (LIBRIUM) 5 MG capsule Take 5 mg by mouth See admin instructions. Four  times daily for 4 doses    Historical Provider, MD    Allergies:  Allergies  Allergen Reactions  . Sulfonamide Derivatives     Social History:   reports that he has never smoked. He does not have any smokeless tobacco history on file. He reports that he drinks alcohol. He reports that he does not use illicit drugs.  Family History: No family history on file.  Review of Systems:   Positive for:  anorexia, tremors, muscle weakness.   The patient deniesfever, weight loss, vision loss, decreased hearing, hoarseness, chest pain, syncope, dyspnea on exertion, peripheral edema, balance deficits,  hemoptysis, abdominal pain, melena, hematochezia, severe indigestion/heartburn, hematuria, incontinence, genital sores suspicious skin lesions, transient blindness, difficulty walking, depression, unusual weight change, abnormal bleeding, enlarged lymph nodes, angioedema, and breast masses.   Physical Exam:  GEN:  Pleasant 56 year old Obese well developed Caucasian male examined  and in no acute distress; cooperative with exam Filed Vitals:   11/27/11 1644 11/27/11 1645 11/27/11 1809 11/27/11 1906  BP: 90/51 90/51 116/70 137/86  Pulse: 76 82 74 76  Resp:   21 22  SpO2:  92% 100% 97%   Blood pressure 137/86, pulse 76, resp. rate 22, SpO2 97.00%. PSYCH: He is alert and oriented x4; does appear anxious does appear depressed; HEENT: Normocephalic and Atraumatic, Mucous membranes pink; PERRLA; EOM intact; Fundi:  Benign;  No scleral icterus, Nares: Patent, Oropharynx: Clear, Fair Dentition, Neck:  FROM, no cervical lymphadenopathy nor thyromegaly or carotid bruit; no JVD; Breasts:: Not examined CHEST WALL: No tenderness CHEST: Normal respiration, clear to auscultation bilaterally HEART: Regular rate and rhythm; no murmurs rubs or gallops BACK: No kyphosis or scoliosis; no CVA tenderness ABDOMEN: Positive Bowel Sounds, Obese, soft non-tender; no masses, no organomegaly.   Rectal Exam: Not done EXTREMITIES: No bone or joint deformity; age-appropriate arthropathy of the hands and knees; no cyanosis, clubbing or edema; no ulcerations. Genitalia: not examined PULSES: 2+ and symmetric SKIN: Normal hydration no rash or ulceration CNS: Cranial nerves 2-12 grossly intact no focal neurologic deficit   Labs & Imaging Results for orders placed during the hospital encounter of 11/27/11 (from the past 48 hour(s))  COMPREHENSIVE METABOLIC PANEL     Status: Abnormal   Collection Time   11/27/11  5:10 PM      Component Value Range Comment   Sodium 128 (*) 135 - 145 (mEq/L)    Potassium 2.8 (*) 3.5 - 5.1  (mEq/L)    Chloride 88 (*) 96 - 112 (mEq/L)    CO2 28  19 - 32 (mEq/L)    Glucose, Bld 103 (*) 70 - 99 (mg/dL)    BUN 48 (*) 6 - 23 (mg/dL)    Creatinine, Ser 1.61 (*) 0.50 - 1.35 (mg/dL)    Calcium 9.4  8.4 - 10.5 (mg/dL)    Total Protein 6.6  6.0 - 8.3 (g/dL)    Albumin 3.7  3.5 - 5.2 (g/dL)    AST 59 (*) 0 - 37 (U/L)    ALT 47  0 - 53 (U/L)    Alkaline Phosphatase 75  39 - 117 (U/L)    Total Bilirubin 0.8  0.3 - 1.2 (mg/dL)    GFR calc non Af Amer 17 (*) >90 (mL/min)    GFR calc Af Amer 20 (*) >90 (mL/min)   CBC     Status: Abnormal   Collection Time   11/27/11  5:10 PM      Component Value Range Comment  WBC 9.7  4.0 - 10.5 (K/uL)    RBC 3.64 (*) 4.22 - 5.81 (MIL/uL)    Hemoglobin 12.4 (*) 13.0 - 17.0 (g/dL)    HCT 16.1 (*) 09.6 - 52.0 (%)    MCV 98.6  78.0 - 100.0 (fL)    MCH 34.1 (*) 26.0 - 34.0 (pg)    MCHC 34.5  30.0 - 36.0 (g/dL)    RDW 04.5  40.9 - 81.1 (%)    Platelets 115 (*) 150 - 400 (K/uL)   DIFFERENTIAL     Status: Abnormal   Collection Time   11/27/11  5:10 PM      Component Value Range Comment   Neutrophils Relative 53  43 - 77 (%)    Lymphocytes Relative 28  12 - 46 (%)    Monocytes Relative 17 (*) 3 - 12 (%)    Eosinophils Relative 2  0 - 5 (%)    Basophils Relative 0  0 - 1 (%)    Neutro Abs 5.2  1.7 - 7.7 (K/uL)    Lymphs Abs 2.7  0.7 - 4.0 (K/uL)    Monocytes Absolute 1.6 (*) 0.1 - 1.0 (K/uL)    Eosinophils Absolute 0.2  0.0 - 0.7 (K/uL)    Basophils Absolute 0.0  0.0 - 0.1 (K/uL)    Smear Review MORPHOLOGY UNREMARKABLE      No results found.    Assessment: Present on Admission:  .ARF (acute renal failure) .Alcohol withdrawal .Hyponatremia .Hypotension .Cirrhosis with alcoholism .Thrombocytopenia   Plan:     Admit to Stepdown Unit Continue Fluid Rescusitation Correct Electrolytes and Monitor Electrolytes and Bun/Cr Cehck Urine electrolytes and [place].   CIWA Protocol Reconcile regular meds.    SCDs for DVT prophylaxis.   Other  plans as per orders.    CODE STATUS:      FULL CODE      Critical care time: 60 minutes.   Selenia Mihok C 11/27/2011, 7:19 PM

## 2011-11-27 NOTE — ED Notes (Addendum)
MD Allen at bedside.

## 2011-11-28 LAB — CBC
HCT: 34.9 % — ABNORMAL LOW (ref 39.0–52.0)
MCH: 33.6 pg (ref 26.0–34.0)
MCHC: 34.4 g/dL (ref 30.0–36.0)
MCV: 97.8 fL (ref 78.0–100.0)
RDW: 14.2 % (ref 11.5–15.5)
WBC: 8.4 10*3/uL (ref 4.0–10.5)

## 2011-11-28 LAB — BASIC METABOLIC PANEL
BUN: 39 mg/dL — ABNORMAL HIGH (ref 6–23)
CO2: 24 mEq/L (ref 19–32)
Chloride: 98 mEq/L (ref 96–112)
Creatinine, Ser: 2.18 mg/dL — ABNORMAL HIGH (ref 0.50–1.35)
Glucose, Bld: 122 mg/dL — ABNORMAL HIGH (ref 70–99)

## 2011-11-28 MED ORDER — SODIUM CHLORIDE 0.9 % NICU IV INFUSION SIMPLE
INJECTION | INTRAVENOUS | Status: DC
  Administered 2011-11-28 (×2): 75 mL/h via INTRAVENOUS
  Administered 2011-11-29: 07:00:00 via INTRAVENOUS
  Filled 2011-11-28 (×5): qty 500

## 2011-11-28 MED ORDER — POTASSIUM CHLORIDE 20 MEQ/15ML (10%) PO LIQD
40.0000 meq | Freq: Once | ORAL | Status: AC
  Administered 2011-11-28: 40 meq via ORAL
  Filled 2011-11-28: qty 30

## 2011-11-28 MED ORDER — ATENOLOL 50 MG PO TABS
50.0000 mg | ORAL_TABLET | Freq: Every day | ORAL | Status: DC
  Administered 2011-11-28 – 2011-11-29 (×2): 50 mg via ORAL
  Filled 2011-11-28 (×2): qty 1

## 2011-11-28 NOTE — Progress Notes (Signed)
Subjective: Patient feels much better today. Dizziness has resolved. His wife is at the bedside.  Objective: Weight change:   Intake/Output Summary (Last 24 hours) at 11/28/11 1422 Last data filed at 11/28/11 1300  Gross per 24 hour  Intake   4312 ml  Output   1395 ml  Net   2917 ml    Filed Vitals:   11/28/11 0837  BP: 120/75  Pulse: 80  Temp:   Resp:    General: Patient does not seem to be in any acute distress. HEENT: Head normocephalic atraumatic. Cardiovascular: Regular rate rhythm. Lungs: Clear to auscultation bilaterally. Abdomen: Positive bowel sounds nondistended. Extremities: No edema.  Lab Results: Reviewed     Medications: Scheduled Meds:   . sodium chloride   Intravenous Once  . sodium chloride   Intravenous STAT  . atenolol  50 mg Oral Daily  . folic acid  1 mg Oral Daily  . LORazepam  1 mg Intravenous Once  . mulitivitamin with minerals  1 tablet Oral Daily  . potassium chloride  10 mEq Intravenous Q1 Hr x 3  . potassium chloride  40 mEq Oral Once  . potassium chloride  40 mEq Oral Once  . general admission iv infusion   Intravenous Once  . sodium chloride  1,000 mL Intravenous Once  . sodium chloride  500 mL Intravenous Once  . thiamine  100 mg Oral Daily   Or  . thiamine  100 mg Intravenous Daily   Continuous Infusions:   . sodium chloride 0.9 % 75 mL/hr (11/28/11 1209)   PRN Meds:.acetaminophen, acetaminophen, HYDROmorphone, LORazepam, LORazepam, ondansetron (ZOFRAN) IV, ondansetron, oxyCODONE  Assessment/Plan: ARF (acute renal failure) (11/27/2011) Patient's acute renal failure most likely secondary to hypotension and dehydration. With volume resuscitation creatinine is improving. Will recheck creatinine levels tomorrow.   HYPERTENSION (05/02/2008)/hypotension  Patient hypotension most likely secondary to a combination of his antihypertensive medications and benzodiazepine for withdrawal. With volume resuscitation and holding his blood  pressure medications his hypotension has resolved. Will start back Atenolol at a lower dose. Continue CIWA protocol.  Alcohol withdrawal (11/27/2011) As mentioned above continue CIWA protocol.  Patient to discharge to [facility] when medically stable.    Hyponatremia (11/27/2011) Improving with hydration. Will continue to monitor .  Hypokalemia (11/27/2011) Replete potassium low potassium most likely secondary to diuretic    Cirrhosis with alcoholism (11/27/2011)/Thrombocytopenia (11/27/2011) Alcohol related, stable   Disposition [facility] when medically stable. Dr. Kevan Ny will resume care on 4/23       LOS: 1 day   Carollee Massed Pager [phone] 11/28/2011, 2:22 PM

## 2011-11-28 NOTE — Progress Notes (Signed)
INITIAL ADULT NUTRITION ASSESSMENT Date: 11/28/2011   Time: 2:20 PM Reason for Assessment: Nutrition Risk-unintentional weight loss  ASSESSMENT: Male 56 y.o.  Dx: ARF (acute renal failure).  Admitted to [facility] 4 days ago for alcohol detox therapy and was seen int he ED for increased dizziness due to low blood pressure.  Hx:  Past Medical History  Diagnosis Date  . Hypertension   . GERD (gastroesophageal reflux disease)   . Blood transfusion   . Cancer    Past Surgical History  Procedure Date  . Neck surgery   . Prostate surgery    Related Meds: folic acid, MVI, KCL, thiamine  Ht: 6' (182.9 cm)  Wt: 192 lb 14.4 oz (87.499 kg)  Ideal Wt: 81 kg % Ideal Wt: 108  Usual Wt: recent unknown Wt Readings from Last 3 Encounters:  11/27/11 192 lb 14.4 oz (87.499 kg)  06/19/09 230 lb 4 oz (104.441 kg)  05/15/09 226 lb 6.1 oz (102.686 kg)    % Usual Wt: recent unknown  Body mass index is 26.16 kg/(m^2).  Food/Nutrition Related Hx: reported recent unintentional weight loss 34# weight loss int he past 3 years.  Labs:  CMP     Component Value Date/Time   NA 134* 11/28/2011 0438   K 3.2* 11/28/2011 0438   CL 98 11/28/2011 0438   CO2 24 11/28/2011 0438   GLUCOSE 122* 11/28/2011 0438   BUN 39* 11/28/2011 0438   CREATININE 2.18* 11/28/2011 0438   CALCIUM 8.9 11/28/2011 0438   PROT 6.6 11/27/2011 1710   ALBUMIN 3.7 11/27/2011 1710   AST 59* 11/27/2011 1710   ALT 47 11/27/2011 1710   ALKPHOS 75 11/27/2011 1710   BILITOT 0.8 11/27/2011 1710   GFRNONAA 32* 11/28/2011 0438   GFRAA 37* 11/28/2011 0438    I/O last 3 completed shifts: In: 3592 [P.O.:342; I.V.:2950; IV Piggyback:300] Out: 625 [Urine:625] Total I/O In: 720 [P.O.:720] Out: 770 [Urine:770]  Diet Order: Regular- Eating 100% of meals  Supplements/Tube Feeding:none  IVF:    sodium chloride 0.9 % Last Rate: 75 mL/hr (11/28/11 1209)    Estimated Nutritional Needs:per day   Kcal: 2100-2200 Protein: 75-85  gm Fluid: 2.1L  NUTRITION DIAGNOSIS: None at this time.   MONITORING/EVALUATION(Goals): Monitor intake, labs, weight trend Goal:  Meet 100% estimated needs with po meals.  EDUCATION NEEDS: -No education needs identified at this time  INTERVENTION: Continue current diet.  Monitor for needs.  Dietitian [phone]  DOCUMENTATION CODES Per approved criteria  -Not Applicable    Jeoffrey Massed 11/28/2011, 2:20 PM

## 2011-11-28 NOTE — Progress Notes (Signed)
Clinical Social Work Department BRIEF PSYCHOSOCIAL ASSESSMENT 11/28/2011  Patient:  Kevin Blackburn,Kevin Blackburn     Account Number:  [id number]     Admit date:  11/27/2011  Clinical Social Worker:  Orpah Greek  Date/Time:  11/28/2011 03:17 PM  Referred by:  Care Management  Date Referred:  11/28/2011 Referred for  Other - See comment   Other Referral:   Admitted from [facility] (inpatient ETOH treatment facility)   Interview type:  Patient Other interview type:    PSYCHOSOCIAL DATA Living Status:  OTHER Admitted from facility:  [facility] Level of care:   Primary support name:  Blanton Kardell (wife) cell#: [phone] Primary support relationship to patient:  SPOUSE Degree of support available:    CURRENT CONCERNS Current Concerns  Post-Acute Placement   Other Concerns:    SOCIAL WORK ASSESSMENT / PLAN CSW spoke with patient & wife re: discharge planning. Patient was admitted from [facility] & plans to return when stable. Patient states he had been at [facility] for about a week. CSW awaiting call back from [facility] to confirm that patient is ok to return. Anticipating discharge tomorrow.   Assessment/plan status:  Information/Referral to [facility] Other assessment/ plan:   Information/referral to community resources:   Patient states he has been impressed so far with [facility] & the services they offer for residential ETOH treatment.    PATIENT'S/FAMILY'S RESPONSE TO PLAN OF CARE:       Unice Bailey, [place] [phone]

## 2011-11-29 LAB — CBC
HCT: 34.6 % — ABNORMAL LOW (ref 39.0–52.0)
Platelets: 133 10*3/uL — ABNORMAL LOW (ref 150–400)
RDW: 14.4 % (ref 11.5–15.5)
WBC: 6.3 10*3/uL (ref 4.0–10.5)

## 2011-11-29 LAB — COMPREHENSIVE METABOLIC PANEL
Alkaline Phosphatase: 71 U/L (ref 39–117)
BUN: 24 mg/dL — ABNORMAL HIGH (ref 6–23)
GFR calc Af Amer: 75 mL/min — ABNORMAL LOW (ref >90)
Glucose, Bld: 106 mg/dL — ABNORMAL HIGH (ref 70–99)
Potassium: 3.1 mEq/L — ABNORMAL LOW (ref 3.5–5.1)
Total Bilirubin: 0.8 mg/dL (ref 0.3–1.2)
Total Protein: 6 g/dL (ref 6.0–8.3)

## 2011-11-29 MED ORDER — ATENOLOL 50 MG PO TABS: 50.0000 mg | ORAL_TABLET | Freq: Every day | ORAL | Status: AC

## 2011-11-29 NOTE — Discharge Summary (Signed)
Physician Discharge Summary  NAME:Kevin Blackburn  ZOX:[id number]  DOB: 11-06-1955   Admit date: 11/27/2011 Discharge date: 11/29/2011  Discharge Diagnoses:  Acute renal failure secondary to nausea vomiting and diuretics use hypertension -  Resolved History of hypertension GERD allergic rhinitis History of colon polyps and family history of colon cancer History of prostate cancer - treated in April 2004 per Dr. Barron Alvine History of alcohol abuse  History of collapsed lung in the past, treated with chest tube  History of rectal bleeding with anal fissures and hemorrhoids in the past  Asthma  Situational depression                                                                                                                                                                                        Discharge Condition: Much improved  Hospital Course: [name] is a pleasant 56 year old male who was admitted to [facility] 6 days ago for alcohol detox therapy and was found to be hypotensive and transferred to the [facility] long emergency room where he was found to be hypotensive and have an elevated BUN and creatinine. He was felt to be dehydrated. He had severe vomiting the day prior to his hypotension. Nausea and vomiting has resolved. He has been rehydrated and creatinine, which was on admission 3.63 is now down to 1.21. He is eating and drinking well and ambulating well he is prepared to transfer back to [facility] for completion of his alcohol detox therapy  Filed Vitals:   11/28/11 2101 11/28/11 2129 11/29/11 0111 11/29/11 0546  BP: 161/101 178/99 155/91 145/80  Pulse: 84 84 69 80  Temp: 98 F (36.7 C)  98.4 F (36.9 C) 98.5 F (36.9 C)  TempSrc: Oral  Oral Oral  Resp: 18 20 16 17   Height:      Weight:      SpO2: 96% 99% 98% 96%    General: Patient does not seem to be in any acute distress.  HEENT: Head normocephalic atraumatic.  Cardiovascular: Regular rate  rhythm.  Lungs: Clear to auscultation bilaterally.  Abdomen: Positive bowel sounds nondistended.  Extremities: No edema. Neuro: Alert and oriented x3, affect relatively flat. Cranial nerves II through XII grossly intact. Nonfocal exam.  Disposition: Patient will be transported back to [facility] by his wife at discharge  Discharge Orders    Future Orders Please Complete By Expires   Diet - low sodium heart healthy      Increase activity slowly      Discharge instructions      Comments:   Call M.D. for recurrent nausea and vomiting or blood pressure systolic less  than 90   Call MD for:  persistant nausea and vomiting      Call MD for:  difficulty breathing, headache or visual disturbances      Call MD for:  persistant dizziness or light-headedness        Medication List  As of 11/29/2011  7:08 AM   STOP taking these medications         atenolol-chlorthalidone 100-25 MG per tablet         TAKE these medications         albuterol 108 (90 BASE) MCG/ACT inhaler   Commonly known as: PROVENTIL HFA;VENTOLIN HFA   Inhale 2 puffs into the lungs every 6 (six) hours as needed. For shortness of breath and wheezing      alum & mag hydroxide-simeth 200-200-20 MG/5ML suspension   Commonly known as: MAALOX/MYLANTA   Take 10-20 mLs by mouth every 6 (six) hours as needed. For indigestion      anti-nausea solution   Take 15-30 mLs by mouth every 15 (fifteen) minutes as needed. Until relief      aspirin EC 81 MG tablet   Take 81 mg by mouth daily.      atenolol 50 MG tablet   Commonly known as: TENORMIN   Take 1 tablet (50 mg total) by mouth daily.      Benzocaine 10 MG Lozg   Use as directed 1 lozenge in the mouth or throat every 3 (three) hours as needed. Sore throat      benzonatate 100 MG capsule   Commonly known as: TESSALON   Take 100-200 mg by mouth every 8 (eight) hours as needed. For cough      budesonide-formoterol 160-4.5 MCG/ACT inhaler   Commonly known as: SYMBICORT    Inhale 2 puffs into the lungs 2 (two) times daily.      chlordiazePOXIDE 10 MG capsule   Commonly known as: LIBRIUM   Take 35 mg by mouth once as needed.      chlordiazePOXIDE 10 MG capsule   Commonly known as: LIBRIUM   Take 10 mg by mouth See admin instructions. Four times daily for 4 doses      chlordiazePOXIDE 5 MG capsule   Commonly known as: LIBRIUM   Take 5 mg by mouth See admin instructions. Four times daily for 4 doses      diazepam 5 MG/ML injection   Commonly known as: VALIUM   Inject 10 mg into the vein once.      guaiFENesin 600 MG 12 hr tablet   Commonly known as: MUCINEX   Take 1,200 mg by mouth 2 (two) times daily at 10 AM and 5 PM.      loperamide 2 MG capsule   Commonly known as: IMODIUM   Take 2-4 mg by mouth 4 (four) times daily as needed. Following each diarrhea stool      MULTIPLE VITAMIN-FOLIC ACID PO   Take 1 tablet by mouth daily.      potassium chloride 20 MEQ packet   Commonly known as: KLOR-CON   Take 20 mEq by mouth daily.      sertraline 50 MG tablet   Commonly known as: ZOLOFT   Take 50 mg by mouth daily.      thiamine 100 MG tablet   Commonly known as: VITAMIN B-1   Take 100 mg by mouth daily.             Things to follow up in the outpatient setting: Monitor  blood pressure while [facility] for either elevated blood pressures or low blood pressures   Time coordinating discharge: 25 minutes   The results of significant diagnostics from this hospitalization (including imaging, microbiology, ancillary and laboratory) are listed below for reference.    Significant Diagnostic Studies: No results found.  Microbiology: No results found for this or any previous visit (from the past 240 hour(s)).   Labs: Results for orders placed during the hospital encounter of 11/27/11  COMPREHENSIVE METABOLIC PANEL      Component Value Range   Sodium 128 (*) 135 - 145 (mEq/L)   Potassium 2.8 (*) 3.5 - 5.1 (mEq/L)   Chloride 88 (*) 96 -  112 (mEq/L)   CO2 28  19 - 32 (mEq/L)   Glucose, Bld 103 (*) 70 - 99 (mg/dL)   BUN 48 (*) 6 - 23 (mg/dL)   Creatinine, Ser 9.14 (*) 0.50 - 1.35 (mg/dL)   Calcium 9.4  8.4 - 78.2 (mg/dL)   Total Protein 6.6  6.0 - 8.3 (g/dL)   Albumin 3.7  3.5 - 5.2 (g/dL)   AST 59 (*) 0 - 37 (U/L)   ALT 47  0 - 53 (U/L)   Alkaline Phosphatase 75  39 - 117 (U/L)   Total Bilirubin 0.8  0.3 - 1.2 (mg/dL)   GFR calc non Af Amer 17 (*) >90 (mL/min)   GFR calc Af Amer 20 (*) >90 (mL/min)  CBC      Component Value Range   WBC 9.7  4.0 - 10.5 (K/uL)   RBC 3.64 (*) 4.22 - 5.81 (MIL/uL)   Hemoglobin 12.4 (*) 13.0 - 17.0 (g/dL)   HCT 95.6 (*) 21.3 - 52.0 (%)   MCV 98.6  78.0 - 100.0 (fL)   MCH 34.1 (*) 26.0 - 34.0 (pg)   MCHC 34.5  30.0 - 36.0 (g/dL)   RDW 08.6  57.8 - 46.9 (%)   Platelets 115 (*) 150 - 400 (K/uL)  DIFFERENTIAL      Component Value Range   Neutrophils Relative 53  43 - 77 (%)   Lymphocytes Relative 28  12 - 46 (%)   Monocytes Relative 17 (*) 3 - 12 (%)   Eosinophils Relative 2  0 - 5 (%)   Basophils Relative 0  0 - 1 (%)   Neutro Abs 5.2  1.7 - 7.7 (K/uL)   Lymphs Abs 2.7  0.7 - 4.0 (K/uL)   Monocytes Absolute 1.6 (*) 0.1 - 1.0 (K/uL)   Eosinophils Absolute 0.2  0.0 - 0.7 (K/uL)   Basophils Absolute 0.0  0.0 - 0.1 (K/uL)   Smear Review MORPHOLOGY UNREMARKABLE    NA AND K (SODIUM & POTASSIUM), RAND UR      Component Value Range   Sodium, Ur 29     Potassium Urine Timed 24    OSMOLALITY, URINE      Component Value Range   Osmolality, Ur 265 (*) 390 - 1090 (mOsm/kg)  BASIC METABOLIC PANEL      Component Value Range   Sodium 134 (*) 135 - 145 (mEq/L)   Potassium 3.2 (*) 3.5 - 5.1 (mEq/L)   Chloride 98  96 - 112 (mEq/L)   CO2 24  19 - 32 (mEq/L)   Glucose, Bld 122 (*) 70 - 99 (mg/dL)   BUN 39 (*) 6 - 23 (mg/dL)   Creatinine, Ser 6.29 (*) 0.50 - 1.35 (mg/dL)   Calcium 8.9  8.4 - 52.8 (mg/dL)   GFR calc non Af Denyse Dago  32 (*) >90 (mL/min)   GFR calc Af Amer 37 (*) >90 (mL/min)  CBC       Component Value Range   WBC 8.4  4.0 - 10.5 (K/uL)   RBC 3.57 (*) 4.22 - 5.81 (MIL/uL)   Hemoglobin 12.0 (*) 13.0 - 17.0 (g/dL)   HCT 16.1 (*) 09.6 - 52.0 (%)   MCV 97.8  78.0 - 100.0 (fL)   MCH 33.6  26.0 - 34.0 (pg)   MCHC 34.4  30.0 - 36.0 (g/dL)   RDW 04.5  40.9 - 81.1 (%)   Platelets 133 (*) 150 - 400 (K/uL)  MAGNESIUM      Component Value Range   Magnesium 1.4 (*) 1.5 - 2.5 (mg/dL)  COMPREHENSIVE METABOLIC PANEL      Component Value Range   Sodium 137  135 - 145 (mEq/L)   Potassium 3.1 (*) 3.5 - 5.1 (mEq/L)   Chloride 100  96 - 112 (mEq/L)   CO2 29  19 - 32 (mEq/L)   Glucose, Bld 106 (*) 70 - 99 (mg/dL)   BUN 24 (*) 6 - 23 (mg/dL)   Creatinine, Ser 9.14  0.50 - 1.35 (mg/dL)   Calcium 9.4  8.4 - 78.2 (mg/dL)   Total Protein 6.0  6.0 - 8.3 (g/dL)   Albumin 3.2 (*) 3.5 - 5.2 (g/dL)   AST 47 (*) 0 - 37 (U/L)   ALT 44  0 - 53 (U/L)   Alkaline Phosphatase 71  39 - 117 (U/L)   Total Bilirubin 0.8  0.3 - 1.2 (mg/dL)   GFR calc non Af Amer 65 (*) >90 (mL/min)   GFR calc Af Amer 75 (*) >90 (mL/min)  CBC      Component Value Range   WBC 6.3  4.0 - 10.5 (K/uL)   RBC 3.46 (*) 4.22 - 5.81 (MIL/uL)   Hemoglobin 11.6 (*) 13.0 - 17.0 (g/dL)   HCT 95.6 (*) 21.3 - 52.0 (%)   MCV 100.0  78.0 - 100.0 (fL)   MCH 33.5  26.0 - 34.0 (pg)   MCHC 33.5  30.0 - 36.0 (g/dL)   RDW 08.6  57.8 - 46.9 (%)   Platelets 133 (*) 150 - 400 (K/uL)     Signed: Briah Nary NEVILL 11/29/2011, 7:08 AM

## 2011-11-29 NOTE — Progress Notes (Signed)
CSW faxed discharge summary & medications over to [facility] (fax#: [phone]) and confirmed with Joy @ [facility] that patient is ok to return today. Patient's wife, Micahel Omlor (cell#: [phone]) is currently at bedside and will provide transport back today. CSW signing off.   Unice Bailey, LCSWA [phone]

## 2012-03-05 IMAGING — CR DG CHEST 2V
2 series · 2 of 2 positions shown · non-contrast
Comparison: [facility] images for chest CT dated 04/02/2009

CLINICAL DATA: Preoperative respiratory exam.  Herniated cervical
disc.  Cervical spondylosis.

CHEST - 2 VIEW

[view not recorded (1 of 2)]
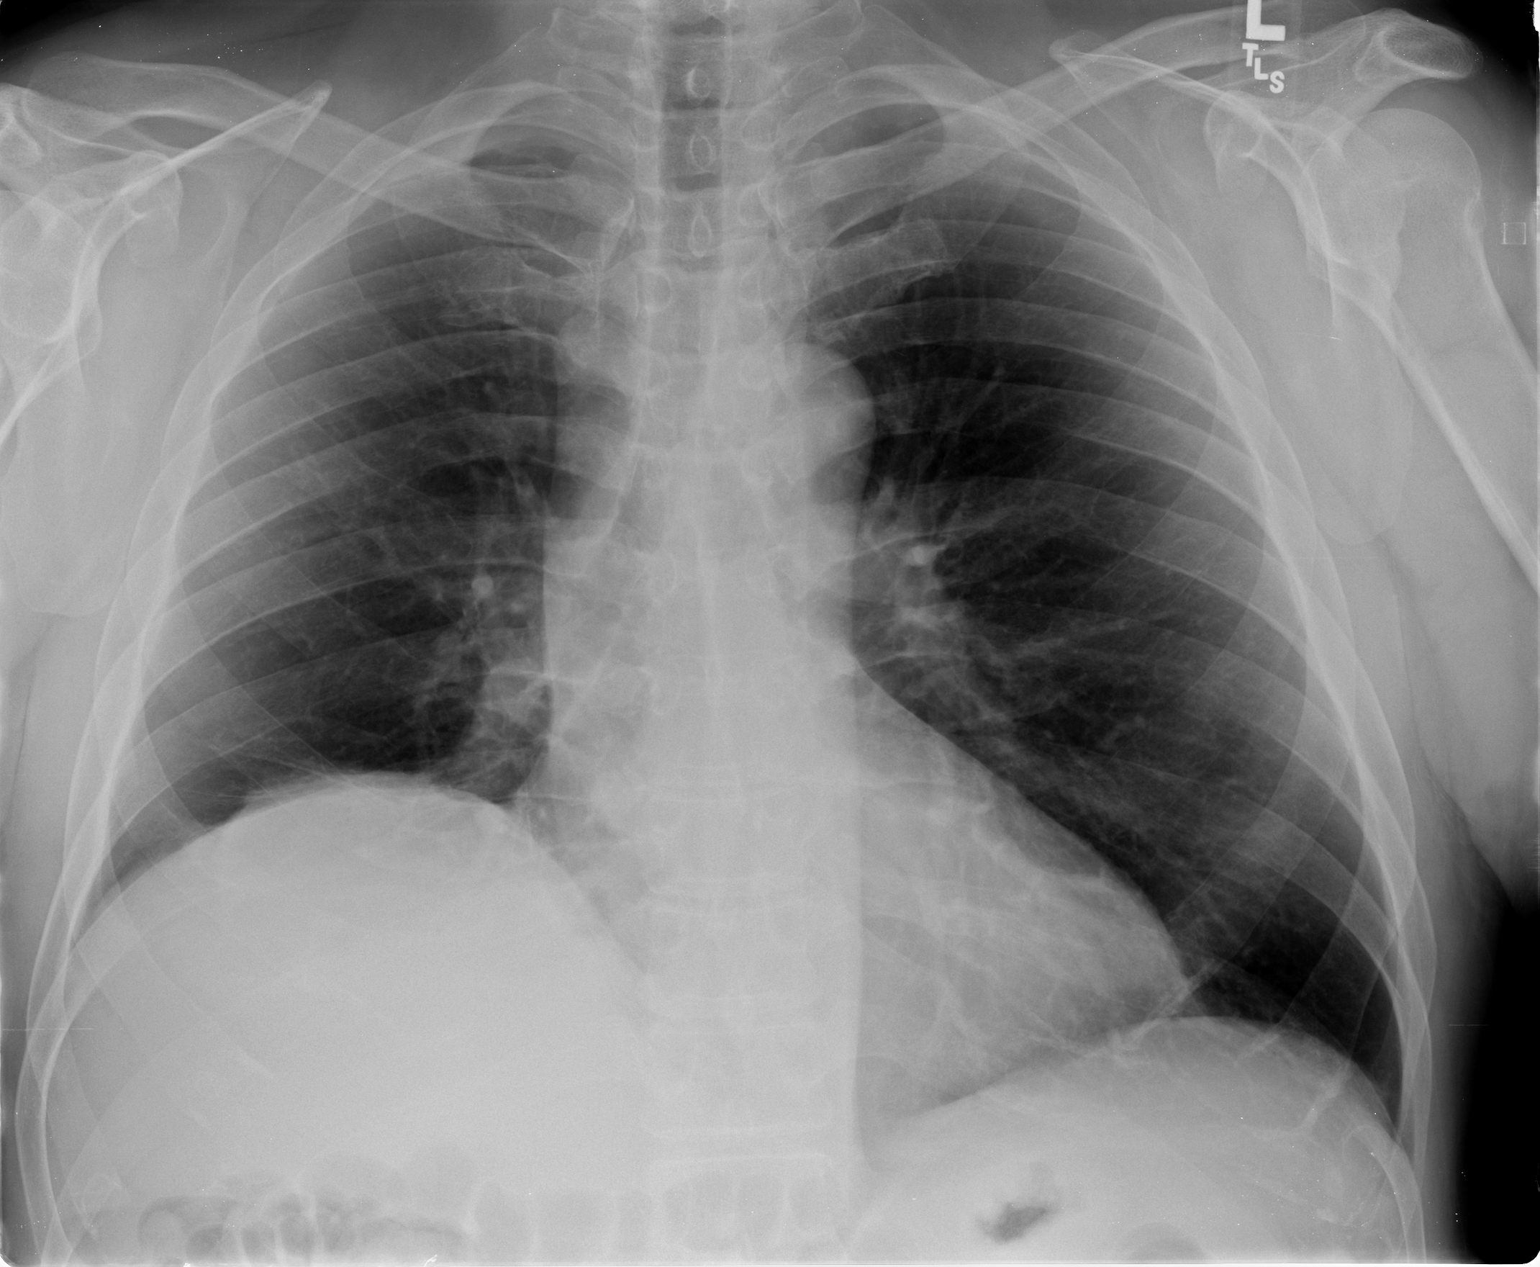

[view not recorded (2 of 2)]
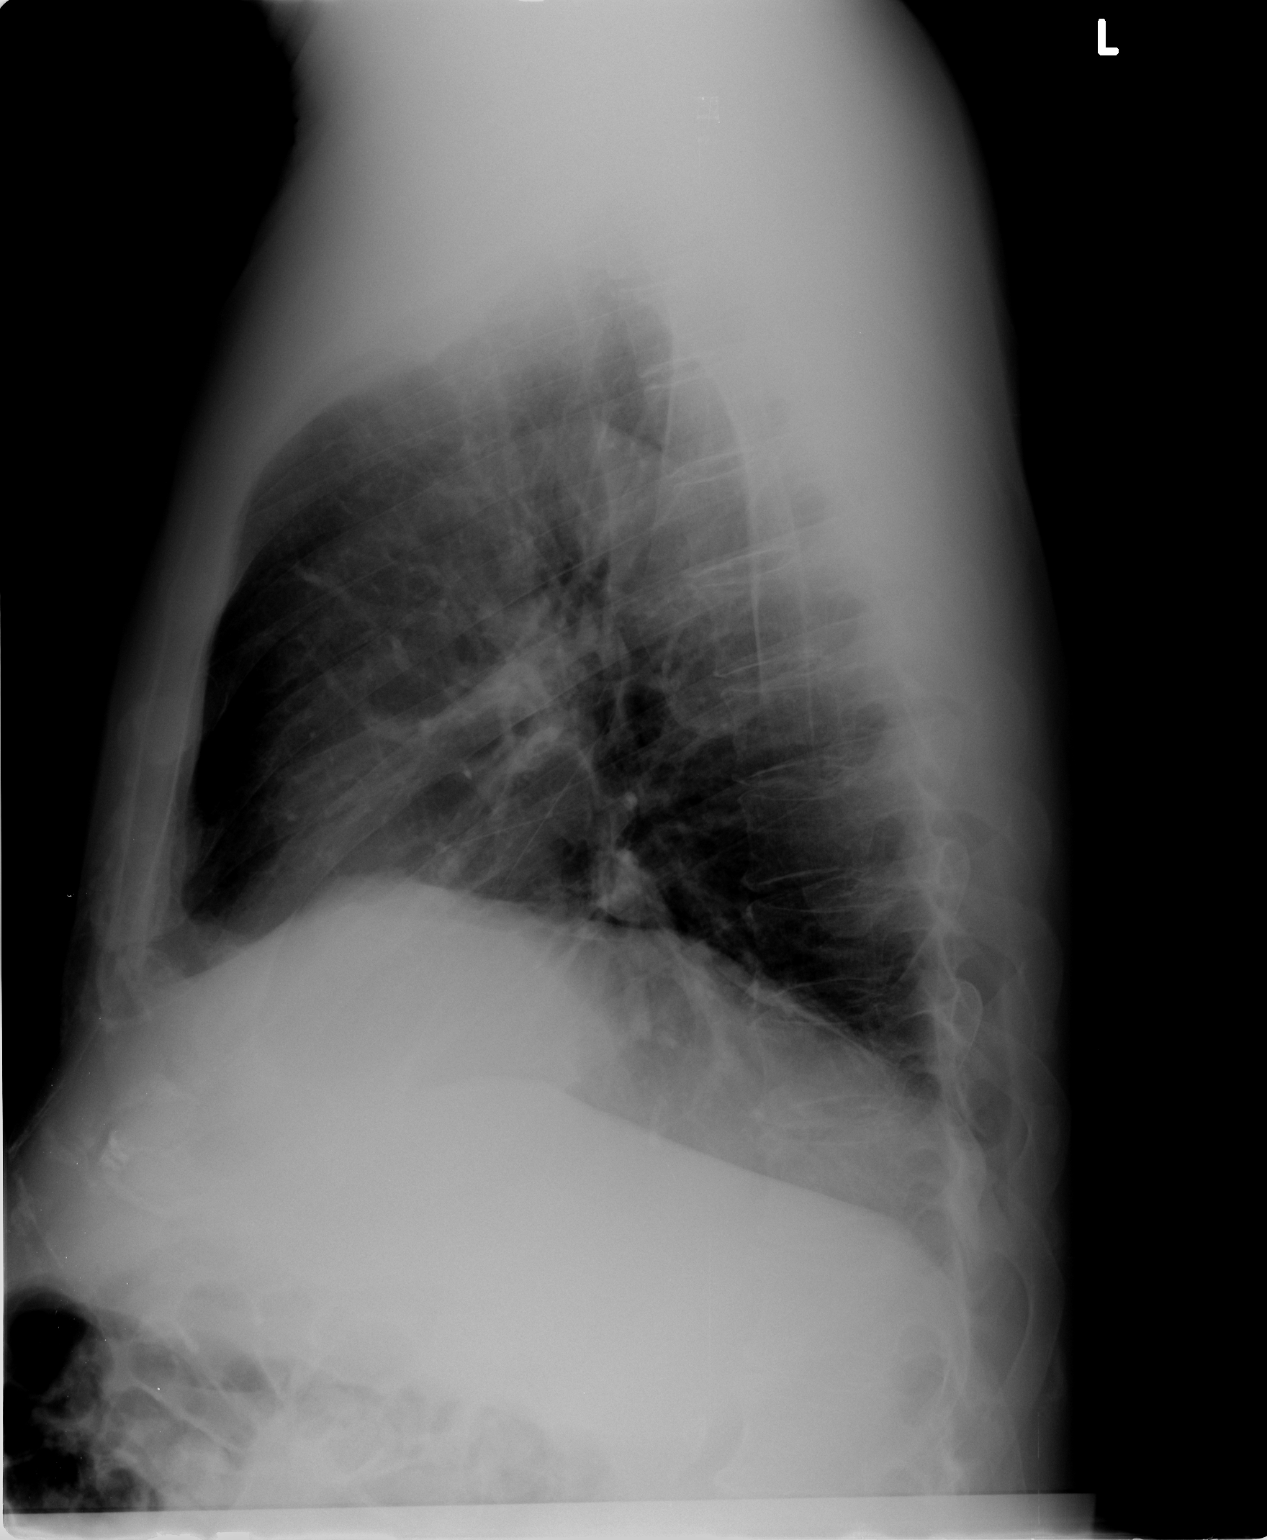

[2 of 2 positions shown; findings below may reference images not displayed]

FINDINGS: Heart size and pulmonary vascularity are normal. There is
chronic elevation of the right hemidiaphragm with chronic slight
atelectasis at the right lung base.  There are no infiltrates or
effusions or osseous abnormalities.
IMPRESSION: No acute disease.  Chronic elevation of the right hemidiaphragm.

## 2013-04-29 ENCOUNTER — Encounter: Payer: Self-pay | Admitting: Internal Medicine

## 2020-08-06 ENCOUNTER — Ambulatory Visit
Admission: RE | Admit: 2020-08-06 | Discharge: 2020-08-06 | Disposition: A | Discharge status: Home / Self Care | Payer: Medicare Other | Source: Ambulatory Visit | Attending: Internal Medicine | Admitting: Internal Medicine

## 2020-08-06 ENCOUNTER — Other Ambulatory Visit: Payer: Self-pay | Admitting: Internal Medicine

## 2020-08-06 DIAGNOSIS — J45909 Unspecified asthma, uncomplicated: Secondary | ICD-10-CM

## 2020-09-16 ENCOUNTER — Ambulatory Visit (INDEPENDENT_AMBULATORY_CARE_PROVIDER_SITE_OTHER): Payer: Medicare Other | Admitting: Pulmonary Disease

## 2020-09-16 ENCOUNTER — Other Ambulatory Visit: Payer: Self-pay

## 2020-09-16 ENCOUNTER — Encounter: Payer: Self-pay | Admitting: Pulmonary Disease

## 2020-09-16 VITALS — BP 140/80 | HR 78 | Temp 98.2°F | Ht 72.0 in | Wt 232.0 lb

## 2020-09-16 DIAGNOSIS — J4540 Moderate persistent asthma, uncomplicated: Secondary | ICD-10-CM

## 2020-09-16 MED ORDER — BREO ELLIPTA 200-25 MCG/INH IN AEPB: 1.0000 | INHALATION_SPRAY | Freq: Every day | RESPIRATORY_TRACT | 0 refills | Status: DC

## 2020-09-16 MED ORDER — BREO ELLIPTA 200-25 MCG/INH IN AEPB: 1.0000 | INHALATION_SPRAY | Freq: Every day | RESPIRATORY_TRACT | 2 refills | Status: DC

## 2020-09-16 NOTE — Progress Notes (Signed)
Kevin Blackburn    [id number]    1956/02/04  Primary Care Physician:Gates, Herbie Baltimore, MD  Referring Physician: Josetta Huddle, MD 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 albuterol (PROVENTIL HFA;VENTOLIN HFA) 108 (90 BASE) MCG/ACT inhaler Inhale 2 puffs into the lungs every 6 (six) hours as needed. For shortness of breath and wheezing  . aspirin EC 81 MG tablet Take 81 mg by mouth daily.  . hydrochlorothiazide (HYDRODIURIL) 25 MG tablet Take 25 mg by mouth daily as needed.  Marland Kitchen ibuprofen (ADVIL) 200 MG tablet Take 200 mg by mouth every 6 (six) hours as needed. 3 tabs  . potassium chloride (KLOR-CON) 20 MEQ packet Take 20 mEq by mouth daily.  Marland Kitchen atenolol (TENORMIN) 50 MG tablet Take 1 tablet (50 mg total) by mouth daily.  . budesonide-formoterol (SYMBICORT) 160-4.5 MCG/ACT inhaler Inhale 2 puffs into the lungs 2 (two) times daily. (Patient not taking: Reported on 09/16/2020)  . guaiFENesin (MUCINEX) 600 MG 12 hr tablet Take 1,200 mg by mouth 2 (two) times daily at 10 AM and 5 PM. (Patient not taking: Reported on 09/16/2020)  . [DISCONTINUED] alum & mag hydroxide-simeth (MAALOX/MYLANTA) 200-200-20 MG/5ML suspension Take 10-20 mLs by mouth every 6 (six) hours as needed. For indigestion (Patient not taking: Reported on 09/16/2020)  . [DISCONTINUED] anti-nausea (EMETROL) solution Take 15-30 mLs by mouth every 15  (fifteen) minutes as needed. Until relief (Patient not taking: Reported on 09/16/2020)  . [DISCONTINUED] Benzocaine 10 MG LOZG Use as directed 1 lozenge in the mouth or throat every 3 (three) hours as needed. Sore throat (Patient not taking: Reported on 09/16/2020)  . [DISCONTINUED] benzonatate (TESSALON) 100 MG capsule Take 100-200 mg by mouth every 8 (eight) hours as needed. For cough (Patient not taking: Reported on 09/16/2020)  . [DISCONTINUED] chlordiazePOXIDE (LIBRIUM) 10 MG capsule Take 35 mg by mouth once as needed. (Patient not taking: Reported on 09/16/2020)  . [DISCONTINUED] chlordiazePOXIDE (LIBRIUM) 10 MG capsule Take 10 mg by mouth See admin instructions. Four times daily for 4 doses (Patient not taking: Reported on 09/16/2020)  . [DISCONTINUED] chlordiazePOXIDE (LIBRIUM) 5 MG capsule Take 5 mg by mouth See admin instructions. Four times daily for 4 doses (Patient not taking: Reported on 09/16/2020)  . [DISCONTINUED] diazepam (VALIUM) 5 MG/ML injection Inject 10 mg into the vein once. (Patient not taking: Reported on 09/16/2020)  . [DISCONTINUED] loperamide (IMODIUM) 2 MG capsule Take 2-4 mg by mouth 4 (four) times daily as needed. Following each diarrhea stool (Patient not taking: Reported on 09/16/2020)  . [DISCONTINUED] MULTIPLE VITAMIN-FOLIC ACID PO Take 1 tablet by mouth daily. (Patient not taking: Reported on 09/16/2020)  . [DISCONTINUED] sertraline (ZOLOFT) 50 MG tablet Take 50 mg by mouth daily. (Patient not taking: Reported on 09/16/2020)  . [DISCONTINUED] thiamine (VITAMIN B-1) 100 MG tablet Take 100 mg by mouth daily. (Patient not taking:  Reported on 09/16/2020)   No facility-administered encounter medications on file as of 09/16/2020.    Allergies as of 09/16/2020 - Review Complete 09/16/2020  Allergen Reaction Noted  . Sulfonamide derivatives      Past Medical History:  Diagnosis Date  . Blood transfusion   . Cancer ([place])   . GERD (gastroesophageal reflux disease)   . Hypertension      Past Surgical History:  Procedure Laterality Date  . NECK SURGERY    . PROSTATE SURGERY      No family history on file.  Social History   Socioeconomic History  . Marital status: Married    Spouse name: Not on file  . Number of children: Not on file  . Years of education: Not on file  . Highest education level: Not on file  Occupational History  . Not on file  Tobacco Use  . Smoking status: Former Smoker    Packs/day: 1.00    Years: 15.00    Pack years: 15.00    Types: Cigarettes    Quit date: 09/08/1990    Years since quitting: 30.0  . Smokeless tobacco: Never Used  Substance and Sexual Activity  . Alcohol use: Yes  . Drug use: No  . Sexual activity: Not on file  Other Topics Concern  . Not on file  Social History Narrative  . Not on file   Social Determinants of Health   Financial Resource Strain: Not on file  Food Insecurity: Not on file  Transportation Needs: Not on file  Physical Activity: Not on file  Stress: Not on file  Social Connections: Not on file  Intimate Partner Violence: Not on file    Review of Systems  Constitutional: Negative for fatigue.  Respiratory: Positive for shortness of breath and wheezing.   Psychiatric/Behavioral: Negative for sleep disturbance.    Vitals:   09/16/20 1134  BP: 140/80  Pulse: 78  Temp: 98.2 F (36.8 C)  SpO2: 98%     Physical Exam Constitutional:      Appearance: He is obese.  HENT:     Head: Normocephalic and atraumatic.     Nose: Nose normal.     Mouth/Throat:     Mouth: Mucous membranes are moist.  Cardiovascular:     Rate and Rhythm: Normal rate and regular rhythm.     Heart sounds: No murmur heard. No friction rub.  Pulmonary:     Effort: No respiratory distress.     Breath sounds: No stridor. No wheezing or rhonchi.  Neurological:     Mental Status: He is alert.    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

## 2020-09-16 NOTE — Patient Instructions (Signed)
  Asthma with poor control recently  We will start you on Breo to be used daily Continue albuterol use as needed  I will see you in 3 months Call with significant concerns

## 2020-12-21 ENCOUNTER — Other Ambulatory Visit: Payer: Self-pay | Admitting: Pulmonary Disease

## 2021-02-26 ENCOUNTER — Other Ambulatory Visit: Payer: Self-pay | Admitting: Pulmonary Disease

## 2021-03-12 ENCOUNTER — Encounter: Payer: Self-pay | Admitting: Pulmonary Disease

## 2021-03-12 ENCOUNTER — Ambulatory Visit (INDEPENDENT_AMBULATORY_CARE_PROVIDER_SITE_OTHER): Payer: Medicare Other | Admitting: Pulmonary Disease

## 2021-03-12 ENCOUNTER — Other Ambulatory Visit: Payer: Self-pay

## 2021-03-12 VITALS — BP 120/74 | HR 66 | Temp 98.1°F | Ht 73.0 in | Wt 225.4 lb

## 2021-03-12 DIAGNOSIS — J4540 Moderate persistent asthma, uncomplicated: Secondary | ICD-10-CM | POA: Diagnosis not present

## 2021-03-12 NOTE — Patient Instructions (Signed)
No changes to be made at present  Continue using Breo regularly Call if you are running out  Use albuterol only as needed  Regular exercises as tolerated  I will see you back in a year

## 2021-03-12 NOTE — Progress Notes (Signed)
Kevin Blackburn    [id number]    Aug 22, 1955  Primary Care Physician:Gates, Herbie Baltimore, MD  Referring Physician: Josetta Huddle, MD 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 For shortness of breath and wheezing   aspirin EC 81 MG tablet Take 81 mg by mouth daily.   atenolol (TENORMIN) 50 MG tablet Take 1 tablet (50 mg total) by mouth daily.   BREO ELLIPTA 200-25 MCG/INH AEPB INHALE 1 PUFF INTO THE LUNGS DAILY   hydrochlorothiazide (HYDRODIURIL) 25 MG tablet Take 25 mg by mouth daily as needed.   ibuprofen (ADVIL) 200 MG tablet Take 200 mg by mouth every 6 (six) hours as needed. 3 tabs   potassium chloride (KLOR-CON) 20 MEQ packet Take 20 mEq by mouth daily.   [DISCONTINUED] fluticasone furoate-vilanterol (BREO ELLIPTA) 200-25 MCG/INH AEPB Inhale 1 puff into the lungs daily.   [DISCONTINUED] guaiFENesin (MUCINEX) 600 MG 12 hr tablet Take 1,200 mg by mouth 2 (two) times daily at 10 AM and 5 PM.   No facility-administered encounter medications on file as of 03/12/2021.    Allergies as of 03/12/2021 - Review Complete 03/12/2021  Allergen Reaction Noted   Sulfonamide derivatives      Past Medical  History:  Diagnosis Date   Blood transfusion    Cancer ([place])    GERD (gastroesophageal reflux disease)    Hypertension     Past Surgical History:  Procedure Laterality Date   NECK SURGERY     PROSTATE SURGERY      History reviewed. No pertinent family history.  Social History   Socioeconomic History   Marital status: Married    Spouse name: Not on file   Number of children: Not on file   Years of education: Not on file   Highest education level: Not on file  Occupational History   Not on file  Tobacco Use   Smoking status: Former    Packs/day: 1.00    Years: 15.00    Pack years: 15.00    Types: Cigarettes    Quit date: 09/08/1990    Years since quitting: 30.5   Smokeless tobacco: Never  Substance and Sexual Activity   Alcohol use: Yes   Drug use: No   Sexual activity: Not on file  Other Topics Concern   Not on file  Social History Narrative   Not on file   Social Determinants of Health   Financial Resource Strain: Not on file  Food Insecurity: Not on file  Transportation Needs: Not on file  Physical Activity: Not on file  Stress: Not on file  Social Connections: Not on file  Intimate Partner  Violence: Not on file    Review of Systems  Constitutional:  Negative for fatigue.  Respiratory:  Negative for shortness of breath and wheezing.   Psychiatric/Behavioral:  Negative for sleep disturbance.    Vitals:   03/12/21 1429  BP: 120/74  Pulse: 66  Temp: 98.1 F (36.7 C)  SpO2: 94%     Physical Exam Constitutional:      Appearance: He is obese.  HENT:     Mouth/Throat:     Mouth: Mucous membranes are moist.  Cardiovascular:     Rate and Rhythm: Normal rate and regular rhythm.     Heart sounds: No murmur heard.   No friction rub.  Pulmonary:     Effort: No respiratory distress.     Breath sounds: No stridor. No wheezing or rhonchi.  Neurological:     Mental Status: He is alert.  Psychiatric:        Mood and Affect: Mood normal.   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

## 2021-03-26 ENCOUNTER — Telehealth: Payer: Self-pay | Admitting: Pulmonary Disease

## 2021-03-26 MED ORDER — FLUTICASONE FUROATE-VILANTEROL 200-25 MCG/INH IN AEPB: 1.0000 | INHALATION_SPRAY | Freq: Every day | RESPIRATORY_TRACT | 6 refills | Status: DC

## 2021-03-26 NOTE — Telephone Encounter (Signed)
Call made to patient, confirmed DOB. Made aware refill has been sent. Voiced understanding.   Nothing further needed at this time.

## 2021-04-01 ENCOUNTER — Other Ambulatory Visit (HOSPITAL_BASED_OUTPATIENT_CLINIC_OR_DEPARTMENT_OTHER): Payer: Self-pay

## 2021-04-01 ENCOUNTER — Ambulatory Visit: Payer: Medicare Other | Attending: Internal Medicine

## 2021-04-01 DIAGNOSIS — Z23 Encounter for immunization: Secondary | ICD-10-CM

## 2021-04-01 MED ORDER — PFIZER-BIONT COVID-19 VAC-TRIS 30 MCG/0.3ML IM SUSP
INTRAMUSCULAR | 0 refills | Status: DC
  Filled 2021-04-01: qty 0.3, 1d supply, fill #0

## 2021-04-01 NOTE — Progress Notes (Signed)
   Covid-19 Vaccination Clinic  Name:  Kevin Blackburn    MRN: [id number] DOB: April 13, 1956  04/01/2021  [name] was observed post Covid-19 immunization for 15 minutes without incident. He was provided with Vaccine Information Sheet and instruction to access the V-Safe system.   [name] was instructed to call 911 with any severe reactions post vaccine: Difficulty breathing  Swelling of face and throat  A fast heartbeat  A bad rash all over body  Dizziness and weakness   Immunizations Administered     Name Date Dose VIS Date Route   PFIZER Comrnaty(Gray TOP) Covid-19 Vaccine 04/01/2021  3:38 PM 0.3 mL 07/16/2020 Intramuscular   Manufacturer: [place]   Lot: [id number]   NDC: [phone]

## 2021-10-11 ENCOUNTER — Other Ambulatory Visit: Payer: Self-pay | Admitting: Pulmonary Disease

## 2021-11-29 ENCOUNTER — Telehealth: Payer: Self-pay | Admitting: Gastroenterology

## 2021-11-29 NOTE — Telephone Encounter (Signed)
Hi Dr. Silverio Decamp, ? ?D.O.D. ? ?We received a referral/records for colonoscopy. Patient has colon cancer in family. Patient has history with Dr. Earlean Shawl (retiring soon) at [facility]. Last colonoscopy: 10/30/2014. We have obtained records for review. Please advise on scheduling.  ? ?Thank you. ?

## 2022-02-14 IMAGING — DX DG CHEST 2V
2 series · 2 of 2 positions shown · non-contrast
Comparison: 08/26/2010

CLINICAL DATA: Asthma

EXAM:
CHEST - 2 VIEW

[dg chest 2 view (1 of 2)]
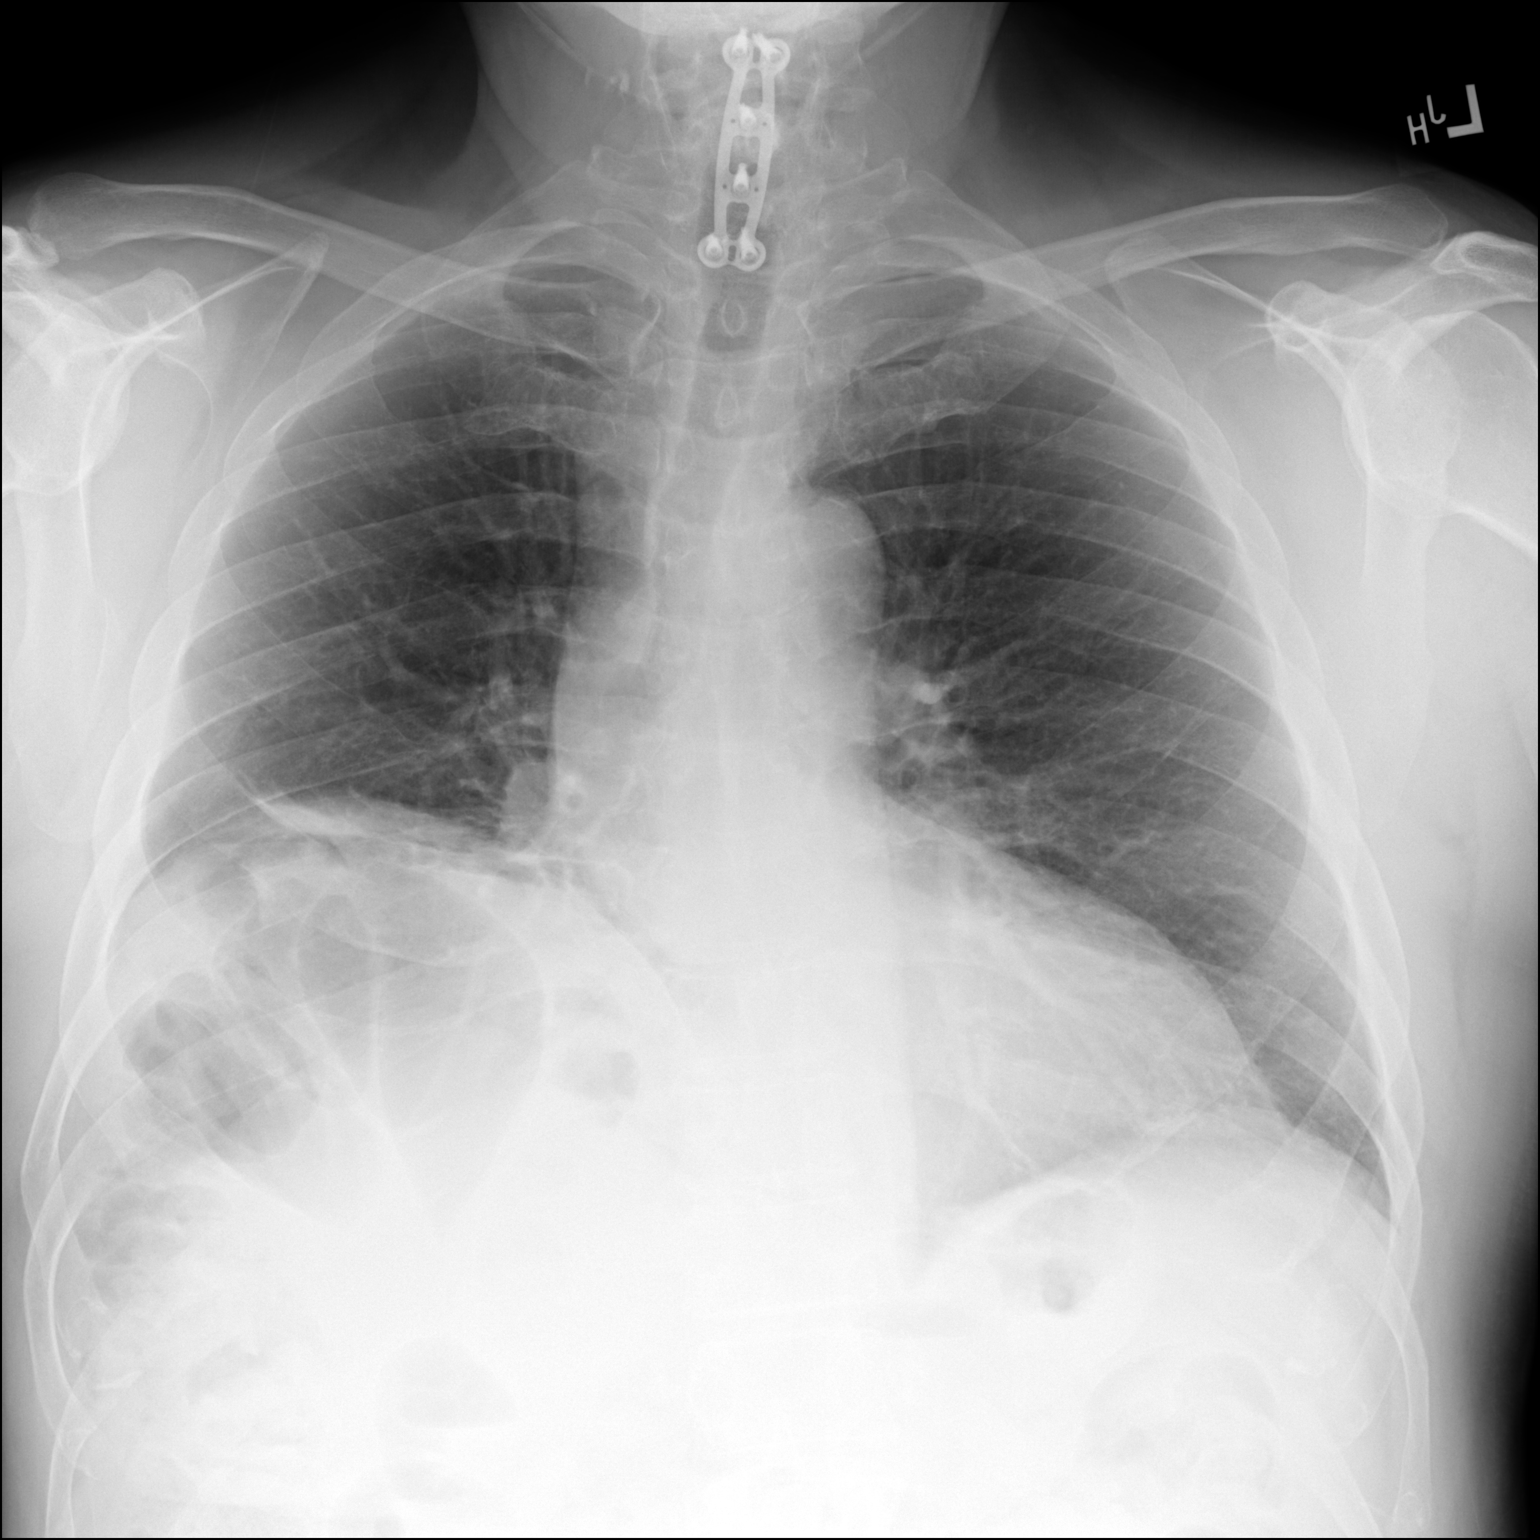

[dg chest 2 view (2 of 2)]
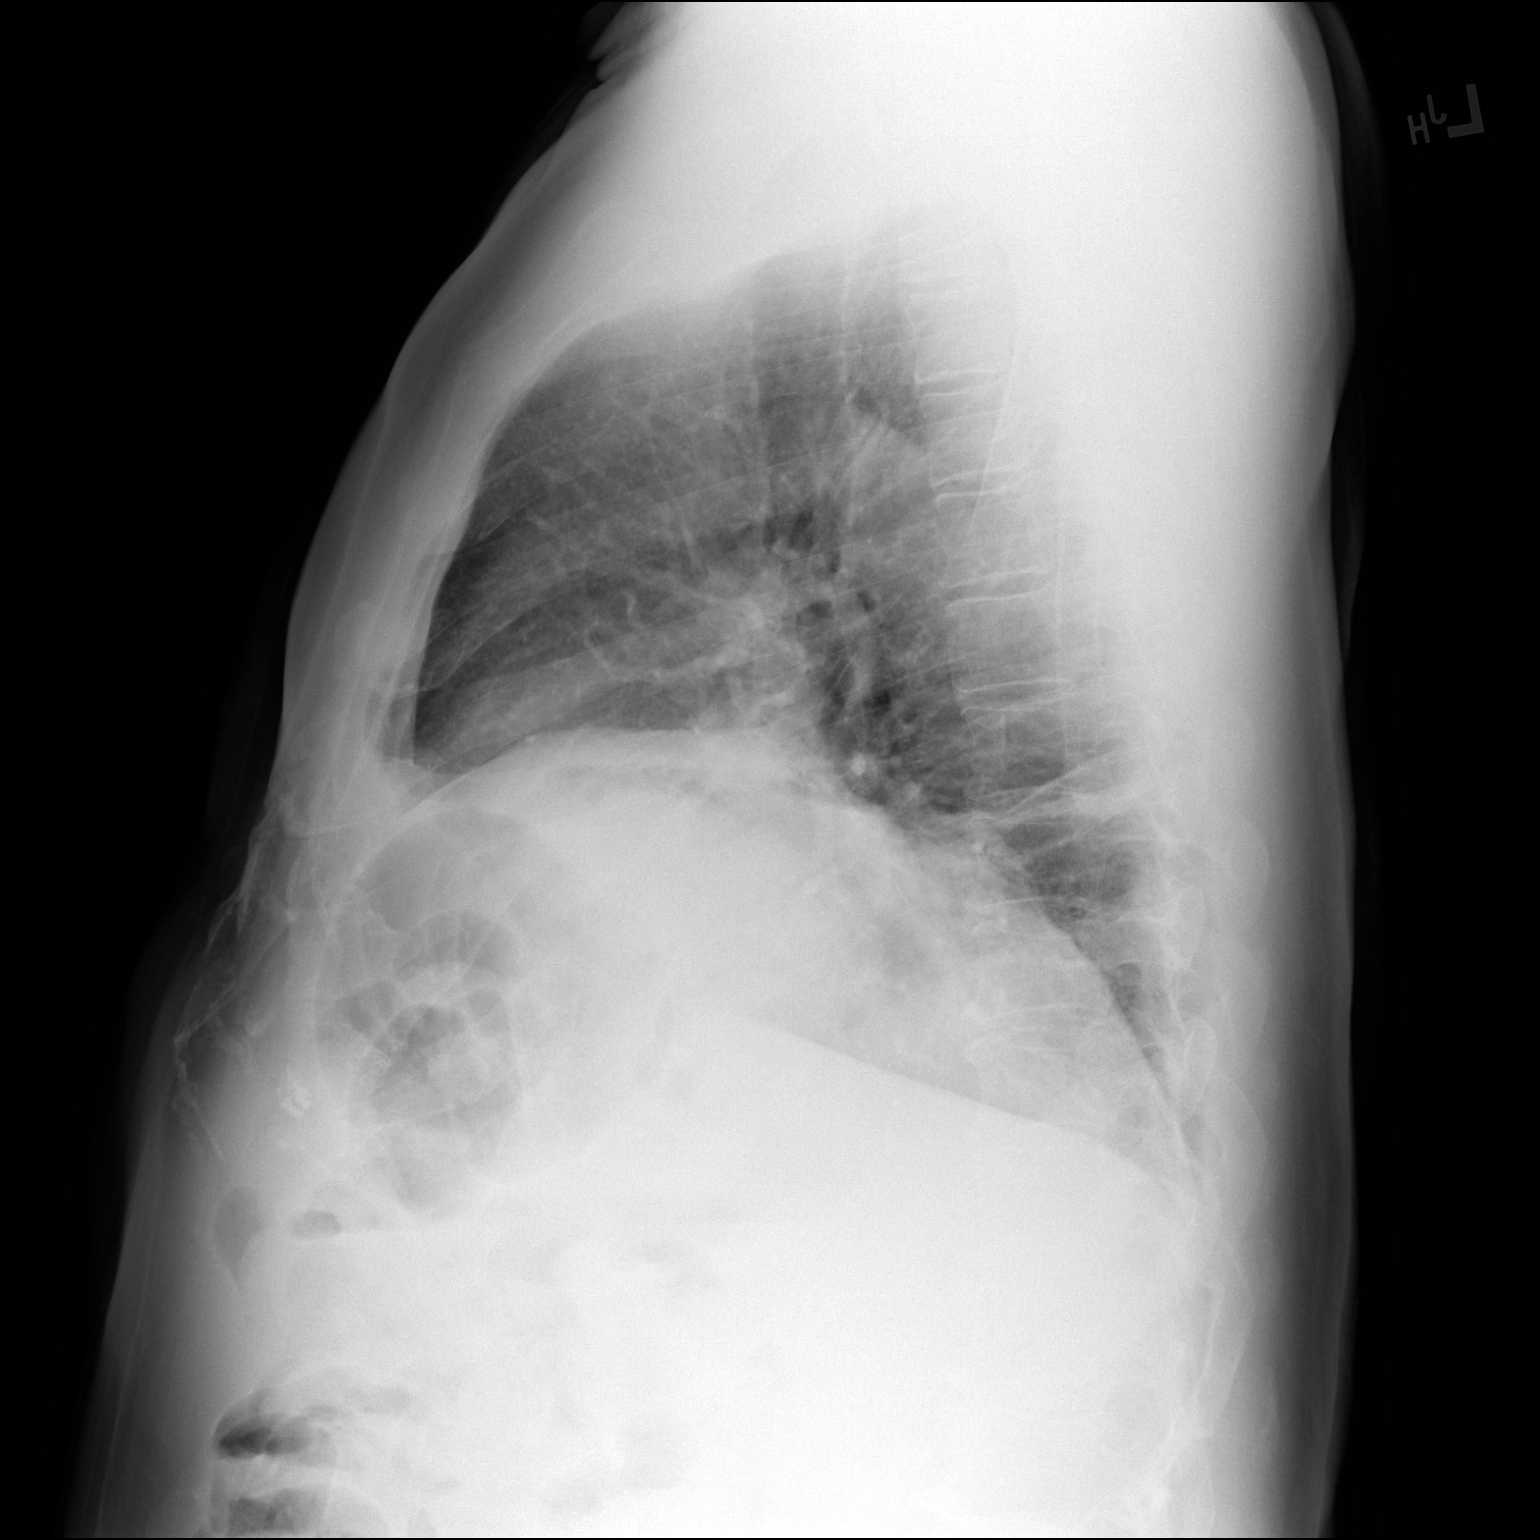

[2 of 2 positions shown; findings below may reference images not displayed]

FINDINGS: Chronic elevation of the right hemidiaphragm. Right base atelectasis
or scarring. Left lung clear. Cardiomegaly. No effusions or edema.
No acute bony abnormality.
IMPRESSION: Stable elevation of the right hemidiaphragm. Chronic right base
atelectasis versus scarring.

Cardiomegaly.

No active disease.

## 2022-03-14 ENCOUNTER — Other Ambulatory Visit: Payer: Self-pay | Admitting: Ophthalmology

## 2022-04-13 ENCOUNTER — Ambulatory Visit (INDEPENDENT_AMBULATORY_CARE_PROVIDER_SITE_OTHER): Payer: Medicare Other | Admitting: Pulmonary Disease

## 2022-04-13 ENCOUNTER — Encounter: Payer: Self-pay | Admitting: Pulmonary Disease

## 2022-04-13 VITALS — BP 124/70 | HR 55 | Temp 97.8°F | Ht 72.0 in | Wt 220.8 lb

## 2022-04-13 DIAGNOSIS — J4540 Moderate persistent asthma, uncomplicated: Secondary | ICD-10-CM | POA: Diagnosis not present

## 2022-04-13 MED ORDER — FLUTICASONE FUROATE-VILANTEROL 200-25 MCG/ACT IN AEPB: 1.0000 | INHALATION_SPRAY | Freq: Every day | RESPIRATORY_TRACT | 6 refills | Status: DC

## 2022-04-13 NOTE — Patient Instructions (Signed)
Continue using Breo daily  Continue albuterol as needed  Call us with any significant concerns otherwise we will see you a year from now  Graded exercises as tolerated at

## 2022-04-13 NOTE — Progress Notes (Signed)
Kevin Blackburn    [id number]    [birth year]  Primary Care Physician:Gates, Herbie [place], MD  Referring Physician: Josetta Huddle, MD 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 For shortness of breath and wheezing   aspirin EC 81 MG tablet Take 81 mg by mouth daily.   BREO ELLIPTA 200-25 MCG/ACT AEPB INHALE 1 PUFF INTO THE LUNGS DAILY   COVID-19 mRNA Vac-TriS, Pfizer, (PFIZER-BIONT COVID-19 VAC-TRIS) SUSP injection Inject into the muscle.   hydrochlorothiazide (HYDRODIURIL) 25 MG tablet Take 25 mg by mouth daily as needed.   ibuprofen (ADVIL) 200 MG tablet Take 200 mg by mouth every 6 (six) hours as needed. 3 tabs   potassium chloride (KLOR-CON) 20 MEQ packet Take 20 mEq by mouth daily.   atenolol (TENORMIN) 50 MG tablet Take 1 tablet (50 mg total) by mouth daily.   No facility-administered encounter medications on file as of 04/13/2022.    Allergies as of 04/13/2022 - Review Complete 04/13/2022  Allergen Reaction Noted   Sulfonamide derivatives      Past Medical History:  Diagnosis Date   Blood transfusion    Cancer ([facility])    GERD (gastroesophageal reflux disease)    Hypertension     Past Surgical History:  Procedure Laterality Date   NECK  SURGERY     PROSTATE SURGERY      No family history on file.  Social History   Socioeconomic History   Marital status: Married    Spouse name: Not on file   Number of children: Not on file   Years of education: Not on file   Highest education level: Not on file  Occupational History   Not on file  Tobacco Use   Smoking status: Former    Packs/day: 1.00    Years: 15.00    Total pack years: 15.00    Types: Cigarettes    Quit date: 09/08/1990    Years since quitting: 31.6   Smokeless tobacco: Never  Substance and Sexual Activity   Alcohol use: Yes   Drug use: No   Sexual activity: Not on file  Other Topics Concern   Not on file  Social History Narrative   Not on file   Social Determinants of Health   Financial Resource Strain: Not on file  Food Insecurity: Not on file  Transportation Needs: Not on file  Physical Activity: Not on file  Stress: Not on file  Social Connections: Not on file  Intimate Partner Violence: Not on file    Review of Systems  Constitutional:  Negative for fatigue.  Respiratory:  Negative for shortness of breath and wheezing.   Psychiatric/Behavioral:  Negative for sleep disturbance.     Vitals:  04/13/22 0855  BP: 124/70  Pulse: (!) 55  Temp: 97.8 F (36.6 C)  SpO2: 97%     Physical Exam Constitutional:      Appearance: He is obese.  HENT:     Mouth/Throat:     Mouth: Mucous membranes are moist.  Cardiovascular:     Rate and Rhythm: Normal rate and regular rhythm.     Heart sounds: No murmur heard.    No friction rub.  Pulmonary:     Effort: No respiratory distress.     Breath sounds: No stridor. No wheezing or rhonchi.  Neurological:     Mental Status: He is alert.  Psychiatric:        Mood and Affect: Mood normal.    Data Reviewed: Chest x-ray reviewed with the patient showing elevated hemidiaphragm on the right -Last chest x-ray was 2021  Assessment:  Moderate persistent asthma -Controlled at  present  Symptoms appear well controlled with Breo  Chronic elevation of the right hemidiaphragm  Plan/Recommendations: Continue Breo  Albuterol as needed  Avoid known triggers  Follow-up in a year  Sherrilyn Rist MD [facility] 04/13/2022, 9:05 AM  CC: Josetta Huddle, MD

## 2022-11-09 ENCOUNTER — Other Ambulatory Visit: Payer: Self-pay | Admitting: Gastroenterology

## 2022-11-09 DIAGNOSIS — Z86010 Personal history of colonic polyps: Secondary | ICD-10-CM

## 2022-11-13 ENCOUNTER — Other Ambulatory Visit: Payer: Self-pay | Admitting: Pulmonary Disease

## 2022-12-20 ENCOUNTER — Ambulatory Visit
Admission: RE | Admit: 2022-12-20 | Discharge: 2022-12-20 | Disposition: A | Discharge status: Home / Self Care | Payer: Medicare Other | Source: Ambulatory Visit | Attending: Gastroenterology | Admitting: Gastroenterology

## 2022-12-20 DIAGNOSIS — Z86010 Personal history of colonic polyps: Secondary | ICD-10-CM

## 2023-05-04 ENCOUNTER — Encounter: Payer: Self-pay | Admitting: Pulmonary Disease

## 2023-05-04 ENCOUNTER — Ambulatory Visit: Payer: Medicare Other | Admitting: Pulmonary Disease

## 2023-05-04 VITALS — BP 118/68 | HR 57 | Temp 97.6°F | Ht 72.0 in | Wt 222.0 lb

## 2023-05-04 DIAGNOSIS — J4540 Moderate persistent asthma, uncomplicated: Secondary | ICD-10-CM

## 2023-05-04 MED ORDER — FLUTICASONE FUROATE-VILANTEROL 200-25 MCG/ACT IN AEPB: 1.0000 | INHALATION_SPRAY | Freq: Every day | RESPIRATORY_TRACT | 6 refills | Status: DC

## 2023-05-04 MED ORDER — ALBUTEROL SULFATE HFA 108 (90 BASE) MCG/ACT IN AERS: 2.0000 | INHALATION_SPRAY | Freq: Four times a day (QID) | RESPIRATORY_TRACT | 6 refills | Status: DC | PRN

## 2023-05-04 NOTE — Patient Instructions (Signed)
I will see you back a year from now  Refills for Breo and albuterol be sent to pharmacy for you  Continue to stay active  Call us with any significant concerns

## 2023-05-04 NOTE — Progress Notes (Signed)
Kevin Blackburn    [id number]    July 16, 1956  Primary Care Physician:Gates, Molly Maduro, MD  Referring Physician: No referring provider defined for this encounter.  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 For shortness of breath and wheezing   aspirin EC 81 MG tablet Take 81 mg by mouth daily.   BREO ELLIPTA 200-25 MCG/ACT AEPB INHALE 1 PUFF BY MOUTH DAILY   hydrochlorothiazide (HYDRODIURIL) 25 MG tablet Take 25 mg by mouth daily as needed.   ibuprofen (ADVIL) 200 MG tablet Take 200 mg by mouth every 6 (six) hours as needed. 3 tabs   potassium chloride (KLOR-CON) 20 MEQ packet Take 20 mEq by mouth daily.   atenolol (TENORMIN) 50 MG tablet Take 1 tablet (50 mg total) by mouth daily.   COVID-19 mRNA Vac-TriS, Pfizer, (PFIZER-BIONT COVID-19 VAC-TRIS) SUSP injection Inject into the muscle. (Patient not taking: Reported on 05/04/2023)   No facility-administered encounter medications on file as of 05/04/2023.    Allergies as of 05/04/2023 - Review Complete 05/04/2023  Allergen Reaction Noted   Sulfonamide derivatives      Past Medical History:  Diagnosis Date   Blood transfusion    Cancer ([facility])    GERD (gastroesophageal reflux disease)    Hypertension     Past Surgical History:  Procedure  Laterality Date   NECK SURGERY     PROSTATE SURGERY      No family history on file.  Social History   Socioeconomic History   Marital status: Married    Spouse name: Not on file   Number of children: Not on file   Years of education: Not on file   Highest education level: Not on file  Occupational History   Not on file  Tobacco Use   Smoking status: Former    Current packs/day: 0.00    Average packs/day: 1 pack/day for 15.0 years (15.0 ttl pk-yrs)    Types: Cigarettes    Start date: 09/09/1975    Quit date: 09/08/1990    Years since quitting: 32.6   Smokeless tobacco: Never  Substance and Sexual Activity   Alcohol use: Yes   Drug use: No   Sexual activity: Not on file  Other Topics Concern   Not on file  Social History Narrative   Not on file   Social Determinants of Health   Financial Resource Strain: Not on file  Food Insecurity: Not on file  Transportation Needs: Not on file  Physical Activity: Not on file  Stress: Not on file  Social Connections: Not on file  Intimate Partner Violence: Not on file    Review of Systems  Constitutional:  Negative for fatigue.  Respiratory:  Negative for shortness of breath and wheezing.   Psychiatric/Behavioral:  Negative for sleep disturbance.     Vitals:   05/04/23 0832  BP: 118/68  Pulse: (!) 57  Temp: 97.6 F (36.4 C)  SpO2: 96%     Physical Exam Constitutional:      Appearance: He is obese.  HENT:     Mouth/Throat:     Mouth: Mucous membranes are moist.  Eyes:     General: No scleral icterus. Cardiovascular:     Rate and Rhythm: Normal rate and regular rhythm.     Heart sounds: No murmur heard.    No friction rub.  Pulmonary:     Effort: No respiratory distress.     Breath sounds: No stridor. No wheezing or rhonchi.  Neurological:     Mental Status: He is alert.  Psychiatric:        Mood and Affect: Mood normal.    Data Reviewed: Chest x-ray reviewed with the patient showing elevated hemidiaphragm  on the right -Last chest x-ray was 2021  Assessment:  Moderate persistent asthma -Controlled symptoms at present  Continues to tolerate Breo well  Chronic elevation of the right hemidiaphragm   Plan/Recommendations: Continue Breo 100  Refills for Breo and albuterol sent to pharmacy  Avoid known triggers  Follow-up a year from now  Encouraged to call with any significant concerns  Virl Diamond MD [facility] 05/04/2023, 9:03 AM  CC: No ref. provider found

## 2023-12-11 ENCOUNTER — Other Ambulatory Visit: Payer: Self-pay | Admitting: Pulmonary Disease

## 2024-05-27 ENCOUNTER — Ambulatory Visit: Admitting: Pulmonary Disease

## 2024-05-27 ENCOUNTER — Encounter: Payer: Self-pay | Admitting: Pulmonary Disease

## 2024-05-27 VITALS — BP 116/68 | HR 59 | Temp 98.1°F | Ht 73.0 in | Wt 221.6 lb

## 2024-05-27 DIAGNOSIS — K7030 Alcoholic cirrhosis of liver without ascites: Secondary | ICD-10-CM | POA: Diagnosis not present

## 2024-05-27 DIAGNOSIS — J4540 Moderate persistent asthma, uncomplicated: Secondary | ICD-10-CM

## 2024-05-27 DIAGNOSIS — C61 Malignant neoplasm of prostate: Secondary | ICD-10-CM | POA: Diagnosis not present

## 2024-05-27 MED ORDER — FLUTICASONE FUROATE-VILANTEROL 200-25 MCG/ACT IN AEPB: 1.0000 | INHALATION_SPRAY | Freq: Every day | RESPIRATORY_TRACT | 6 refills | Status: AC

## 2024-05-27 MED ORDER — ALBUTEROL SULFATE HFA 108 (90 BASE) MCG/ACT IN AERS: 2.0000 | INHALATION_SPRAY | Freq: Four times a day (QID) | RESPIRATORY_TRACT | 6 refills | Status: AC | PRN

## 2024-05-27 NOTE — Patient Instructions (Signed)
 I will see you back a year from now  Continue using Breo as tolerated  Use albuterol  as needed  Continue graded activities as tolerated, regular exercise as tolerated  Will place orders for refills

## 2024-05-27 NOTE — Progress Notes (Signed)
 Kevin Blackburn    [id number]    1956/02/18  Primary Care Physician:Gates, Lamar, MD (Inactive)  Referring Physician: No referring provider defined for this encounter.  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 For shortness of breath and wheezing   aspirin EC 81 MG tablet Take 81 mg by mouth daily.   atenolol  (TENORMIN ) 50 MG tablet Take 1 tablet (50 mg total) by mouth daily.   BREO ELLIPTA  200-25 MCG/ACT AEPB INHALE 1 PUFF BY MOUTH DAILY   hydrochlorothiazide (HYDRODIURIL) 25 MG tablet Take 25 mg by mouth daily as needed.   ibuprofen (ADVIL) 200 MG tablet Take 200 mg by mouth every 6 (six) hours as needed. 3 tabs   potassium chloride  (KLOR-CON ) 20 MEQ packet Take 20 mEq by mouth daily.   [DISCONTINUED] COVID-19 mRNA Vac-TriS, Pfizer, (PFIZER-BIONT COVID-19 VAC-TRIS) SUSP injection Inject into the muscle. (Patient not taking: Reported on 05/04/2023)   No facility-administered encounter medications on file as of 05/27/2024.    Allergies as of 05/27/2024 - Review Complete 05/27/2024  Allergen Reaction Noted   Loratadine Other (See Comments) 05/27/2024   Sulfonamide derivatives Dermatitis 05/27/2024   Verapamil  05/27/2024    Past Medical  History:  Diagnosis Date   Blood transfusion    Cancer ([facility])    GERD (gastroesophageal reflux disease)    Hypertension     Past Surgical History:  Procedure Laterality Date   NECK SURGERY     PROSTATE SURGERY      No family history on file.  Social History   Socioeconomic History   Marital status: Married    Spouse name: Not on file   Number of children: Not on file   Years of education: Not on file   Highest education level: Not on file  Occupational History   Not on file  Tobacco Use   Smoking status: Former    Current packs/day: 0.00    Average packs/day: 1 pack/day for 15.0 years (15.0 ttl pk-yrs)    Types: Cigarettes    Start date: 09/09/1975    Quit date: 09/08/1990    Years since quitting: 33.7   Smokeless tobacco: Never  Substance and Sexual Activity   Alcohol use: Yes   Drug use: No   Sexual activity: Not on file  Other Topics Concern   Not on file  Social History Narrative   Not on file   Social Drivers of Health   Financial Resource Strain: Not on file  Food Insecurity: Not on file  Transportation Needs: Not on file  Physical Activity: Not on file  Stress: Not on file  Social Connections: Not on file  Intimate Partner  Violence: Not on file    Review of Systems  Constitutional:  Negative for fatigue.  Respiratory:  Negative for shortness of breath and wheezing.   Psychiatric/Behavioral:  Negative for sleep disturbance.     Vitals:   05/27/24 0942  BP: 116/68  Pulse: (!) 59  Temp: 98.1 F (36.7 C)  SpO2: 96%     Physical Exam Constitutional:      Appearance: He is obese.  HENT:     Mouth/Throat:     Mouth: Mucous membranes are moist.  Eyes:     General: No scleral icterus. Cardiovascular:     Rate and Rhythm: Normal rate and regular rhythm.     Heart sounds: No murmur heard.    No friction rub.  Pulmonary:     Effort: No respiratory distress.     Breath sounds: No stridor. No wheezing or rhonchi.  Neurological:     Mental  Status: He is alert.  Psychiatric:        Mood and Affect: Mood normal.    Data Reviewed: Chest x-ray reviewed with the patient showing elevated hemidiaphragm on the right -Last chest x-ray was 2021  Assessment:  Moderate persistent asthma-controlled symptoms at present  Continues to tolerate Breo well  Chronically elevated right hemidiaphragm    Plan/Recommendations: Will continue Breo 100  Avoid known triggers  Follow-up a year from now  Encouraged to call with any significant concerns  Medication refill sent in   Jennet Epley MD [facility] 05/27/2024, 9:56 AM  CC: No ref. provider found
